# Patient Record
Sex: Male | Born: 1935 | Race: White | Hispanic: No | Marital: Married | Smoking: Never smoker
Health system: Southern US, Community
[De-identification: ages and names within clinical notes are randomized; demographics above are authoritative.]

## PROBLEM LIST (undated history)

## (undated) DIAGNOSIS — I499 Cardiac arrhythmia, unspecified: Secondary | ICD-10-CM

## (undated) DIAGNOSIS — C801 Malignant (primary) neoplasm, unspecified: Secondary | ICD-10-CM

## (undated) DIAGNOSIS — J302 Other seasonal allergic rhinitis: Secondary | ICD-10-CM

## (undated) DIAGNOSIS — M1712 Unilateral primary osteoarthritis, left knee: Secondary | ICD-10-CM

## (undated) DIAGNOSIS — I10 Essential (primary) hypertension: Secondary | ICD-10-CM

## (undated) DIAGNOSIS — G7000 Myasthenia gravis without (acute) exacerbation: Secondary | ICD-10-CM

## (undated) DIAGNOSIS — S52122A Displaced fracture of head of left radius, initial encounter for closed fracture: Secondary | ICD-10-CM

## (undated) DIAGNOSIS — E119 Type 2 diabetes mellitus without complications: Secondary | ICD-10-CM

## (undated) DIAGNOSIS — K219 Gastro-esophageal reflux disease without esophagitis: Secondary | ICD-10-CM

## (undated) HISTORY — DX: Unilateral primary osteoarthritis, left knee: M17.12

## (undated) HISTORY — PX: APPENDECTOMY: SHX54

## (undated) HISTORY — PX: TONSILLECTOMY: SUR1361

## (undated) HISTORY — PX: KNEE ARTHROSCOPY: SHX127

---
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
## 2005-12-01 ENCOUNTER — Emergency Department (HOSPITAL_COMMUNITY): Admission: EM | Admit: 2005-12-01 | Discharge: 2005-12-01 | Payer: Self-pay | Admitting: *Deleted

## 2012-01-11 DIAGNOSIS — G7000 Myasthenia gravis without (acute) exacerbation: Secondary | ICD-10-CM | POA: Insufficient documentation

## 2012-05-27 DIAGNOSIS — C61 Malignant neoplasm of prostate: Secondary | ICD-10-CM | POA: Insufficient documentation

## 2013-06-30 DIAGNOSIS — N529 Male erectile dysfunction, unspecified: Secondary | ICD-10-CM | POA: Insufficient documentation

## 2013-07-01 DIAGNOSIS — I10 Essential (primary) hypertension: Secondary | ICD-10-CM | POA: Insufficient documentation

## 2013-10-27 ENCOUNTER — Other Ambulatory Visit: Payer: Self-pay | Admitting: Orthopedic Surgery

## 2013-10-27 DIAGNOSIS — S52123A Displaced fracture of head of unspecified radius, initial encounter for closed fracture: Secondary | ICD-10-CM

## 2013-10-28 ENCOUNTER — Ambulatory Visit
Admission: RE | Admit: 2013-10-28 | Discharge: 2013-10-28 | Disposition: A | Discharge status: Home / Self Care | Payer: Medicare Other | Source: Ambulatory Visit | Attending: Orthopedic Surgery | Admitting: Orthopedic Surgery

## 2013-10-28 ENCOUNTER — Encounter (HOSPITAL_BASED_OUTPATIENT_CLINIC_OR_DEPARTMENT_OTHER): Payer: Self-pay | Admitting: *Deleted

## 2013-10-28 DIAGNOSIS — S52123A Displaced fracture of head of unspecified radius, initial encounter for closed fracture: Secondary | ICD-10-CM

## 2013-10-28 NOTE — Progress Notes (Signed)
Unable to reach up to 5pm-left message to be npo p mn-arrive 915am-bring all meds and take am meds with water only Called dr Mikey Bussing his pcp-no labs or ekg this yr

## 2013-10-29 ENCOUNTER — Encounter (HOSPITAL_BASED_OUTPATIENT_CLINIC_OR_DEPARTMENT_OTHER): Payer: Medicare Other | Admitting: Anesthesiology

## 2013-10-29 ENCOUNTER — Encounter (HOSPITAL_BASED_OUTPATIENT_CLINIC_OR_DEPARTMENT_OTHER): Payer: Self-pay

## 2013-10-29 ENCOUNTER — Ambulatory Visit (HOSPITAL_BASED_OUTPATIENT_CLINIC_OR_DEPARTMENT_OTHER): Payer: Medicare Other | Admitting: Anesthesiology

## 2013-10-29 ENCOUNTER — Encounter (HOSPITAL_BASED_OUTPATIENT_CLINIC_OR_DEPARTMENT_OTHER)
Admission: RE | Disposition: A | Discharge status: Home / Self Care | Payer: Self-pay | Source: Ambulatory Visit | Attending: Orthopedic Surgery

## 2013-10-29 ENCOUNTER — Ambulatory Visit (HOSPITAL_BASED_OUTPATIENT_CLINIC_OR_DEPARTMENT_OTHER)
Admission: RE | Admit: 2013-10-29 | Discharge: 2013-10-29 | Disposition: A | Discharge status: Home / Self Care | Payer: Medicare Other | Source: Ambulatory Visit | Attending: Orthopedic Surgery | Admitting: Orthopedic Surgery

## 2013-10-29 DIAGNOSIS — S52123A Displaced fracture of head of unspecified radius, initial encounter for closed fracture: Secondary | ICD-10-CM | POA: Insufficient documentation

## 2013-10-29 DIAGNOSIS — Z01810 Encounter for preprocedural cardiovascular examination: Secondary | ICD-10-CM | POA: Insufficient documentation

## 2013-10-29 DIAGNOSIS — Z8546 Personal history of malignant neoplasm of prostate: Secondary | ICD-10-CM | POA: Insufficient documentation

## 2013-10-29 DIAGNOSIS — X58XXXA Exposure to other specified factors, initial encounter: Secondary | ICD-10-CM | POA: Insufficient documentation

## 2013-10-29 DIAGNOSIS — G7000 Myasthenia gravis without (acute) exacerbation: Secondary | ICD-10-CM | POA: Insufficient documentation

## 2013-10-29 DIAGNOSIS — J301 Allergic rhinitis due to pollen: Secondary | ICD-10-CM | POA: Insufficient documentation

## 2013-10-29 DIAGNOSIS — Z79899 Other long term (current) drug therapy: Secondary | ICD-10-CM | POA: Insufficient documentation

## 2013-10-29 DIAGNOSIS — I10 Essential (primary) hypertension: Secondary | ICD-10-CM | POA: Insufficient documentation

## 2013-10-29 DIAGNOSIS — S52122A Displaced fracture of head of left radius, initial encounter for closed fracture: Secondary | ICD-10-CM

## 2013-10-29 DIAGNOSIS — K219 Gastro-esophageal reflux disease without esophagitis: Secondary | ICD-10-CM | POA: Insufficient documentation

## 2013-10-29 HISTORY — DX: Malignant (primary) neoplasm, unspecified: C80.1

## 2013-10-29 HISTORY — DX: Gastro-esophageal reflux disease without esophagitis: K21.9

## 2013-10-29 HISTORY — DX: Essential (primary) hypertension: I10

## 2013-10-29 HISTORY — DX: Displaced fracture of head of left radius, initial encounter for closed fracture: S52.122A

## 2013-10-29 HISTORY — PX: ORIF ELBOW FRACTURE: SHX5031

## 2013-10-29 HISTORY — DX: Myasthenia gravis without (acute) exacerbation: G70.00

## 2013-10-29 HISTORY — DX: Other seasonal allergic rhinitis: J30.2

## 2013-10-29 LAB — POCT I-STAT, CHEM 8
Calcium, Ion: 1.1 mmol/L — ABNORMAL LOW (ref 1.13–1.30)
Chloride: 103 mEq/L (ref 96–112)
Potassium: 3.7 mEq/L (ref 3.5–5.1)
Sodium: 141 mEq/L (ref 135–145)

## 2013-10-29 SURGERY — OPEN REDUCTION INTERNAL FIXATION (ORIF) ELBOW/OLECRANON FRACTURE
Anesthesia: General | Site: Elbow | Laterality: Left

## 2013-10-29 MED ORDER — LIDOCAINE HCL (CARDIAC) 20 MG/ML IV SOLN
INTRAVENOUS | Status: DC | PRN
  Administered 2013-10-29: 50 mg via INTRAVENOUS

## 2013-10-29 MED ORDER — PROPOFOL 10 MG/ML IV BOLUS
INTRAVENOUS | Status: DC | PRN
  Administered 2013-10-29: 200 mg via INTRAVENOUS

## 2013-10-29 MED ORDER — ONDANSETRON HCL 4 MG/2ML IJ SOLN: 4.0000 mg | Freq: Once | INTRAMUSCULAR | Status: DC | PRN

## 2013-10-29 MED ORDER — FENTANYL CITRATE 0.05 MG/ML IJ SOLN
50.0000 ug | Freq: Once | INTRAMUSCULAR | Status: AC
  Administered 2013-10-29: 100 ug via INTRAVENOUS

## 2013-10-29 MED ORDER — MIDAZOLAM HCL 2 MG/2ML IJ SOLN
INTRAMUSCULAR | Status: AC
  Filled 2013-10-29: qty 2

## 2013-10-29 MED ORDER — BUPIVACAINE-EPINEPHRINE PF 0.5-1:200000 % IJ SOLN
INTRAMUSCULAR | Status: DC | PRN
  Administered 2013-10-29: 25 mL via PERINEURAL

## 2013-10-29 MED ORDER — BUPIVACAINE HCL (PF) 0.5 % IJ SOLN
INTRAMUSCULAR | Status: AC
  Filled 2013-10-29: qty 30

## 2013-10-29 MED ORDER — ONDANSETRON HCL 4 MG/2ML IJ SOLN
INTRAMUSCULAR | Status: DC | PRN
  Administered 2013-10-29: 4 mg via INTRAVENOUS

## 2013-10-29 MED ORDER — EPHEDRINE SULFATE 50 MG/ML IJ SOLN
INTRAMUSCULAR | Status: DC | PRN
  Administered 2013-10-29 (×2): 10 mg via INTRAVENOUS
  Administered 2013-10-29: 5 mg via INTRAVENOUS
  Administered 2013-10-29: 10 mg via INTRAVENOUS

## 2013-10-29 MED ORDER — FENTANYL CITRATE 0.05 MG/ML IJ SOLN: 25.0000 ug | INTRAMUSCULAR | Status: DC | PRN

## 2013-10-29 MED ORDER — MIDAZOLAM HCL 2 MG/2ML IJ SOLN
1.0000 mg | INTRAMUSCULAR | Status: DC | PRN
  Administered 2013-10-29: 2 mg via INTRAVENOUS

## 2013-10-29 MED ORDER — FENTANYL CITRATE 0.05 MG/ML IJ SOLN
INTRAMUSCULAR | Status: AC
  Filled 2013-10-29: qty 2

## 2013-10-29 MED ORDER — BUPIVACAINE HCL (PF) 0.25 % IJ SOLN
INTRAMUSCULAR | Status: AC
  Filled 2013-10-29: qty 30

## 2013-10-29 MED ORDER — DEXAMETHASONE SODIUM PHOSPHATE 10 MG/ML IJ SOLN
INTRAMUSCULAR | Status: DC | PRN
  Administered 2013-10-29: 10 mg via INTRAVENOUS

## 2013-10-29 MED ORDER — CEFAZOLIN SODIUM-DEXTROSE 2-3 GM-% IV SOLR
INTRAVENOUS | Status: DC | PRN
  Administered 2013-10-29: 2 g via INTRAVENOUS

## 2013-10-29 MED ORDER — FENTANYL CITRATE 0.05 MG/ML IJ SOLN
INTRAMUSCULAR | Status: AC
  Filled 2013-10-29: qty 6

## 2013-10-29 MED ORDER — CEFAZOLIN SODIUM-DEXTROSE 2-3 GM-% IV SOLR
INTRAVENOUS | Status: AC
  Filled 2013-10-29: qty 50

## 2013-10-29 MED ORDER — LACTATED RINGERS IV SOLN
INTRAVENOUS | Status: DC
  Administered 2013-10-29 (×2): via INTRAVENOUS

## 2013-10-29 SURGICAL SUPPLY — 72 items
APL SKNCLS STERI-STRIP NONHPOA (GAUZE/BANDAGES/DRESSINGS) ×1
BANDAGE ELASTIC 3 VELCRO ST LF (GAUZE/BANDAGES/DRESSINGS) ×1 IMPLANT
BANDAGE ELASTIC 4 VELCRO ST LF (GAUZE/BANDAGES/DRESSINGS) ×2 IMPLANT
BENZOIN TINCTURE PRP APPL 2/3 (GAUZE/BANDAGES/DRESSINGS) ×2 IMPLANT
BLADE AVERAGE 25X9 (BLADE) ×1 IMPLANT
BLADE MINI RND TIP GREEN BEAV (BLADE) IMPLANT
BLADE SURG 15 STRL LF DISP TIS (BLADE) ×1 IMPLANT
BLADE SURG 15 STRL SS (BLADE) ×2
BNDG CMPR 9X4 STRL LF SNTH (GAUZE/BANDAGES/DRESSINGS) ×1
BNDG CMPR MD 5X2 ELC HKLP STRL (GAUZE/BANDAGES/DRESSINGS)
BNDG COHESIVE 4X5 TAN STRL (GAUZE/BANDAGES/DRESSINGS) ×1 IMPLANT
BNDG ELASTIC 2 VLCR STRL LF (GAUZE/BANDAGES/DRESSINGS) IMPLANT
BNDG ESMARK 4X9 LF (GAUZE/BANDAGES/DRESSINGS) ×2 IMPLANT
CANISTER SUCT 1200ML W/VALVE (MISCELLANEOUS) ×2 IMPLANT
COVER TABLE BACK 60X90 (DRAPES) ×2 IMPLANT
CUFF TOURNIQUET SINGLE 18IN (TOURNIQUET CUFF) IMPLANT
DECANTER SPIKE VIAL GLASS SM (MISCELLANEOUS) ×1 IMPLANT
DRAPE EXTREMITY T 121X128X90 (DRAPE) ×2 IMPLANT
DRAPE OEC MINIVIEW 54X84 (DRAPES) ×2 IMPLANT
DRAPE U 20/CS (DRAPES) ×2 IMPLANT
DRAPE U-SHAPE 47X51 STRL (DRAPES) ×2 IMPLANT
DURAPREP 26ML APPLICATOR (WOUND CARE) ×2 IMPLANT
ELECT REM PT RETURN 9FT ADLT (ELECTROSURGICAL) ×2
ELECTRODE REM PT RTRN 9FT ADLT (ELECTROSURGICAL) ×1 IMPLANT
GAUZE XEROFORM 1X8 LF (GAUZE/BANDAGES/DRESSINGS) IMPLANT
GLOVE BIO SURGEON STRL SZ8 (GLOVE) ×2 IMPLANT
GLOVE BIOGEL M STRL SZ7.5 (GLOVE) ×1 IMPLANT
GLOVE BIOGEL PI IND STRL 8 (GLOVE) ×2 IMPLANT
GLOVE BIOGEL PI INDICATOR 8 (GLOVE) ×3
GLOVE ORTHO TXT STRL SZ7.5 (GLOVE) ×2 IMPLANT
GOWN BRE IMP PREV XXLGXLNG (GOWN DISPOSABLE) ×4 IMPLANT
HEAD SYS ANA RAD 28.00 HEAD LT (Head) ×1 IMPLANT
NDL HYPO 25X1 1.5 SAFETY (NEEDLE) IMPLANT
NEEDLE HYPO 25X1 1.5 SAFETY (NEEDLE) IMPLANT
NS IRRIG 1000ML POUR BTL (IV SOLUTION) ×2 IMPLANT
PACK BASIN DAY SURGERY FS (CUSTOM PROCEDURE TRAY) ×2 IMPLANT
PAD CAST 3X4 CTTN HI CHSV (CAST SUPPLIES) IMPLANT
PAD CAST 4YDX4 CTTN HI CHSV (CAST SUPPLIES) ×1 IMPLANT
PADDING CAST ABS 4INX4YD NS (CAST SUPPLIES) ×2
PADDING CAST ABS COTTON 4X4 ST (CAST SUPPLIES) ×1 IMPLANT
PADDING CAST COTTON 3X4 STRL (CAST SUPPLIES) ×2
PADDING CAST COTTON 4X4 STRL (CAST SUPPLIES) ×4
PADDING UNDERCAST 2  STERILE (CAST SUPPLIES) ×1 IMPLANT
PENCIL BUTTON HOLSTER BLD 10FT (ELECTRODE) ×2 IMPLANT
SLEEVE SCD COMPRESS KNEE MED (MISCELLANEOUS) ×2 IMPLANT
SLING ARM FOAM STRAP LRG (SOFTGOODS) ×1 IMPLANT
SPLINT FAST PLASTER 5X30 (CAST SUPPLIES) ×10
SPLINT PLASTER CAST FAST 5X30 (CAST SUPPLIES) IMPLANT
SPLINT PLASTER CAST XFAST 4X15 (CAST SUPPLIES) IMPLANT
SPLINT PLASTER XTRA FAST SET 4 (CAST SUPPLIES)
SPONGE GAUZE 4X4 12PLY (GAUZE/BANDAGES/DRESSINGS) ×2 IMPLANT
SPONGE LAP 4X18 X RAY DECT (DISPOSABLE) ×2 IMPLANT
STEM RADIAL HEAD 9X+6X25 TITA (Stem) ×1 IMPLANT
STOCKINETTE 4X48 STRL (DRAPES) ×2 IMPLANT
STRIP CLOSURE SKIN 1/2X4 (GAUZE/BANDAGES/DRESSINGS) ×2 IMPLANT
SUCTION FRAZIER TIP 10 FR DISP (SUCTIONS) ×2 IMPLANT
SUT ETHIBOND 0 MO6 C/R (SUTURE) IMPLANT
SUT ETHILON 3 0 PS 1 (SUTURE) IMPLANT
SUT ETHILON 4 0 PS 2 18 (SUTURE) IMPLANT
SUT MNCRL AB 4-0 PS2 18 (SUTURE) ×3 IMPLANT
SUT VIC AB 0 CT3 27 (SUTURE) ×1 IMPLANT
SUT VIC AB 0 SH 27 (SUTURE) IMPLANT
SUT VIC AB 2-0 SH 27 (SUTURE) ×2
SUT VIC AB 2-0 SH 27XBRD (SUTURE) IMPLANT
SUT VIC AB 3-0 SH 27 (SUTURE) ×2
SUT VIC AB 3-0 SH 27X BRD (SUTURE) ×1 IMPLANT
SUT VICRYL 3-0 CR8 SH (SUTURE) IMPLANT
SYR BULB 3OZ (MISCELLANEOUS) ×3 IMPLANT
SYR CONTROL 10ML LL (SYRINGE) IMPLANT
TOWEL OR 17X24 6PK STRL BLUE (TOWEL DISPOSABLE) ×2 IMPLANT
TUBE CONNECTING 20X1/4 (TUBING) ×2 IMPLANT
UNDERPAD 30X30 INCONTINENT (UNDERPADS AND DIAPERS) ×2 IMPLANT

## 2013-10-29 NOTE — Anesthesia Preprocedure Evaluation (Signed)
Anesthesia Evaluation  Patient identified by MRN, date of birth, ID band Patient awake    Reviewed: Allergy & Precautions, H&P , NPO status , Patient's Chart, lab work & pertinent test results  Airway Mallampati: I TM Distance: >3 FB Neck ROM: Full    Dental  (+) Teeth Intact and Dental Advisory Given   Pulmonary  breath sounds clear to auscultation        Cardiovascular hypertension, Pt. on medications Rhythm:Regular Rate:Normal     Neuro/Psych  Neuromuscular disease    GI/Hepatic GERD-  Medicated and Controlled,  Endo/Other    Renal/GU      Musculoskeletal   Abdominal   Peds  Hematology   Anesthesia Other Findings   Reproductive/Obstetrics                           Anesthesia Physical Anesthesia Plan  ASA: III  Anesthesia Plan: General   Post-op Pain Management:    Induction: Intravenous  Airway Management Planned: LMA  Additional Equipment:   Intra-op Plan:   Post-operative Plan: Extubation in OR  Informed Consent: I have reviewed the patients History and Physical, chart, labs and discussed the procedure including the risks, benefits and alternatives for the proposed anesthesia with the patient or authorized representative who has indicated his/her understanding and acceptance.   Dental advisory given  Plan Discussed with: CRNA, Anesthesiologist and Surgeon  Anesthesia Plan Comments:         Anesthesia Quick Evaluation

## 2013-10-29 NOTE — Anesthesia Postprocedure Evaluation (Signed)
  Anesthesia Post-op Note  Patient: Gary Sherman  Procedure(s) Performed: Procedure(s):  LEFT RADIAL HEAD  ARTHROPLASTY (Left)  Patient Location: PACU  Anesthesia Type:GA combined with regional for post-op pain  Level of Consciousness: awake, alert  and oriented  Airway and Oxygen Therapy: Patient Spontanous Breathing and Patient connected to face mask oxygen  Post-op Pain: none  Post-op Assessment: Post-op Vital signs reviewed  Post-op Vital Signs: Reviewed  Complications: No apparent anesthesia complications

## 2013-10-29 NOTE — Anesthesia Procedure Notes (Addendum)
Anesthesia Regional Block:  Supraclavicular block  Pre-Anesthetic Checklist: ,, timeout performed, Correct Patient, Correct Site, Correct Laterality, Correct Procedure, Correct Position, site marked, Risks and benefits discussed,  Surgical consent,  Pre-op evaluation,  At surgeon's request and post-op pain management  Laterality: Left and Upper  Prep: chloraprep       Needles:  Injection technique: Single-shot  Needle Type: Echogenic Stimulator Needle     Needle Length: 5cm 5 cm Needle Gauge: 21 and 21 G    Additional Needles:  Procedures: ultrasound guided (picture in chart) Supraclavicular block Narrative:  Start time: 10/29/2013 11:45 AM End time: 10/29/2013 11:55 AM Injection made incrementally with aspirations every 5 mL.  Performed by: Personally  Anesthesiologist: Sheldon Silvan   Procedure Name: LMA Insertion Date/Time: 10/29/2013 12:14 PM Performed by: Caren Macadam Pre-anesthesia Checklist: Patient identified, Emergency Drugs available, Suction available and Patient being monitored Patient Re-evaluated:Patient Re-evaluated prior to inductionOxygen Delivery Method: Circle System Utilized Preoxygenation: Pre-oxygenation with 100% oxygen Intubation Type: IV induction Ventilation: Mask ventilation without difficulty LMA: LMA inserted LMA Size: 5.0 Number of attempts: 1 Airway Equipment and Method: bite block Placement Confirmation: positive ETCO2 and breath sounds checked- equal and bilateral Tube secured with: Tape Dental Injury: Teeth and Oropharynx as per pre-operative assessment

## 2013-10-29 NOTE — Transfer of Care (Signed)
Immediate Anesthesia Transfer of Care Note  Patient: Gary Sherman  Procedure(s) Performed: Procedure(s):  LEFT RADIAL HEAD  ARTHROPLASTY (Left)  Patient Location: PACU  Anesthesia Type:GA combined with regional for post-op pain  Level of Consciousness: awake, oriented and patient cooperative  Airway & Oxygen Therapy: Patient Spontanous Breathing and Patient connected to face mask oxygen  Post-op Assessment: Report given to PACU RN and Post -op Vital signs reviewed and stable  Post vital signs: Reviewed and stable  Complications: No apparent anesthesia complications

## 2013-10-29 NOTE — H&P (Signed)
PREOPERATIVE H&P  Chief Complaint: left radial head fracture  HPI: Gary Sherman is a 77 y.o. male who presents for preoperative history and physical with a diagnosis of left radial head fracture. Symptoms are rated as moderate to severe, and have been worsening.  This is significantly impairing activities of daily living.  He has elected for surgical management. He has had previous elbow dislocation, and reinjured his elbow. Denies other injuries.  Past Medical History  Diagnosis Date  . Hypertension   . GERD (gastroesophageal reflux disease)   . Myasthenia gravis   . Seasonal allergies   . Cancer     prostate 2007 radation Rx   Past Surgical History  Procedure Laterality Date  . Tonsillectomy    . Appendectomy    . Knee arthroscopy     History   Social History  . Marital Status: Married    Spouse Name: N/A    Number of Children: N/A  . Years of Education: N/A   Social History Main Topics  . Smoking status: Never Smoker   . Smokeless tobacco: None  . Alcohol Use: 1.2 oz/week    2 Cans of beer per week     Comment: occ  . Drug Use: None  . Sexual Activity: None   Other Topics Concern  . None   Social History Narrative  . None   Family History  Problem Relation Age of Onset  . Diabetes type II Father    Allergies  Allergen Reactions  . Nitroglycerin Anaphylaxis   Prior to Admission medications   Medication Sig Start Date End Date Taking? Authorizing Provider  acetaminophen-codeine (TYLENOL #3) 300-30 MG per tablet Take by mouth every 4 (four) hours as needed for moderate pain.   Yes Historical Provider, MD  azaTHIOprine (IMURAN) 50 MG tablet Take 50 mg by mouth daily. Take 150mg  daily   Yes Historical Provider, MD  fluticasone (FLONASE) 50 MCG/ACT nasal spray Place into both nostrils daily.   Yes Historical Provider, MD  ketotifen (ZADITOR) 0.025 % ophthalmic solution 1 drop 2 (two) times daily.   Yes Historical Provider, MD  olmesartan (BENICAR) 40 MG  tablet Take 40 mg by mouth daily.   Yes Historical Provider, MD  omeprazole (PRILOSEC) 20 MG capsule Take 20 mg by mouth daily.   Yes Historical Provider, MD  prednisoLONE 5 MG TABS tablet Take 5 mg by mouth daily.   Yes Historical Provider, MD     Positive ROS: All other systems have been reviewed and were otherwise negative with the exception of those mentioned in the HPI and as above.  Physical Exam: General: Alert, no acute distress Cardiovascular: No pedal edema Respiratory: No cyanosis, no use of accessory musculature GI: No organomegaly, abdomen is soft and non-tender Skin: No lesions in the area of chief complaint Neurologic: Sensation intact distally Psychiatric: Patient is competent for consent with normal mood and affect Lymphatic: No axillary or cervical lymphadenopathy  MUSCULOSKELETAL: all fingers f/e/abduct, pain at elbow, none at wrist  Assessment: left radial head fracture  Plan: Plan for Procedure(s): OPEN REDUCTION INTERNAL FIXATION (ORIF) LEFT RADIAL HEAD VS ARTHROPLASTY  The risks benefits and alternatives were discussed with the patient including but not limited to the risks of nonoperative treatment, versus surgical intervention including infection, bleeding, nerve injury,  blood clots, cardiopulmonary complications, morbidity, mortality, among others, and they were willing to proceed.   Eulas Post, MD Cell [phone]   10/29/2013 11:56 AM

## 2013-10-29 NOTE — Op Note (Signed)
10/29/2013  1:44 PM  PATIENT:  Gary Sherman    PRE-OPERATIVE DIAGNOSIS:  left radial head fracture  POST-OPERATIVE DIAGNOSIS:  Same  PROCEDURE:   LEFT RADIAL HEAD  ARTHROPLASTY  SURGEON:  Eulas Post, MD  PHYSICIAN ASSISTANT: Janace Litten, OPA-C, present and scrubbed during the case, assisting with closure.  ANESTHESIA:   General  PREOPERATIVE INDICATIONS:  [name] is a  77 y.o. male with a diagnosis of left radial head fracture who  elected for surgical management.  His fracture was extremely displaced, with comminution, unrepairable, requiring arthroplasty.  The risks benefits and alternatives were discussed with the patient preoperatively including but not limited to the risks of infection, bleeding, nerve injury, cardiopulmonary complications, the need for revision surgery, among others, and the patient was willing to proceed.  OPERATIVE IMPLANTS: Acumed radial head with a size 9 stem, and 28 mm of radial head with a +6 neck.  OPERATIVE FINDINGS: Comminuted radial head fracture, with severe displacement  OPERATIVE PROCEDURE: The patient is brought to the operating room and placed in the supine position. General anesthesia was administered. IV antibiotics were given. The left upper extremity was prepped and draped in usual sterile fashion. The arm was elevated and exsanguinated and a tourniquet was inflated. Approximate tourniquet time was one hour. Time out was performed. Lateral incision was carried out to the proximal radius, taking care to preserve the lateral collateral ligament, although he had hypertrophic bone on the lateral side, secondary to previous dislocations within the substance of the ligament.  Incision was made through the capsule, and dissection carried down, and the previous radial head fragments that were still contained were extracted. The columns that was still attached to the shaft was fairly poor quality, and not amenable to repair.  I  then freshened the neck cut, and then sequentially reamed, up to a size 9. I trialed the length using the appropriate jig, and selected the size 6. I attempted reduction of the actual implant with the size 6, although if this is too tight, couldn't quite get this reduced, and so I removed a little bit more of the neck and then reduced the prosthesis. Excellent stability was achieved, and the elbow dropped easily to 0, without any impingement, and without any stiffness the soft tissue tension was appropriate.  The wounds were irrigated copiously, the deep tissue repaired with Vicryl, followed by Vicryl for the subcutaneous tissue with Monocryl and Steri-Strips for the skin. He had a regional preoperative block. Posterior splint was applied. He was awakened and returned to PACU in stable and satisfactory condition. There were no complications.

## 2013-10-29 NOTE — Progress Notes (Signed)
  Assisted Dr. Crews with left, ultrasound guided, supraclavicular block. Side rails up, monitors on throughout procedure. See vital signs in flow sheet. Tolerated Procedure well. 

## 2013-10-30 ENCOUNTER — Encounter (HOSPITAL_BASED_OUTPATIENT_CLINIC_OR_DEPARTMENT_OTHER): Payer: Self-pay | Admitting: Orthopedic Surgery

## 2019-01-27 ENCOUNTER — Encounter (HOSPITAL_COMMUNITY): Payer: Self-pay

## 2019-01-27 NOTE — Progress Notes (Signed)
Please place orders in epic . Pt. Scheduled for a preop.Thank You!

## 2019-01-27 NOTE — Patient Instructions (Addendum)
Gary Sherman  01/27/2019   Your procedure is scheduled on: 01-30-19  Report to [facility] Main  Entrance               Report to  Short stay at      0530  AM    Call this number if you have problems the morning of surgery [phone]   Remember: Do not eat food or drink liquids :After Midnight. BRUSH YOUR TEETH MORNING OF SURGERY AND RINSE YOUR MOUTH OUT, NO CHEWING GUM CANDY OR MINTS.     Take these medicines the morning of surgery with A SIP OF WATER: omeprazole, eye drops, gabapentin, flonase DO NOT TAKE ANY DIABETIC MEDICATIONS DAY OF YOUR SURGERY                               You may not have any metal on your body including hair pins and              piercings  Do not wear jewelry,  lotions, powders or perfumes, deodorant                  Men may shave face and neck.   Do not bring valuables to the hospital. [place].  Contacts, dentures or bridgework may not be worn into surgery.  Leave suitcase in the car. After surgery it may be brought to your room.                Please read over the following fact sheets you were given: _____________________________________________________________________           [facility] - Preparing for Surgery Before surgery, you can play an important role.  Because skin is not sterile, your skin needs to be as free of germs as possible.  You can reduce the number of germs on your skin by washing with CHG (chlorahexidine gluconate) soap before surgery.  CHG is an antiseptic cleaner which kills germs and bonds with the skin to continue killing germs even after washing. Please DO NOT use if you have an allergy to CHG or antibacterial soaps.  If your skin becomes reddened/irritated stop using the CHG and inform your nurse when you arrive at Short Stay. Do not shave (including legs and underarms) for at least 48 hours prior to the first CHG shower.  You may shave your  face/neck. Please follow these instructions carefully:  1.  Shower with CHG Soap the night before surgery and the  morning of Surgery.  2.  If you choose to wash your hair, wash your hair first as usual with your  normal  shampoo.  3.  After you shampoo, rinse your hair and body thoroughly to remove the  shampoo.                           4.  Use CHG as you would any other liquid soap.  You can apply chg directly  to the skin and wash                       Gently with a scrungie or clean washcloth.  5.  Apply the CHG Soap to  your body ONLY FROM THE NECK DOWN.   Do not use on face/ open                           Wound or open sores. Avoid contact with eyes, ears mouth and genitals (private parts).                       Wash face,  Genitals (private parts) with your normal soap.             6.  Wash thoroughly, paying special attention to the area where your surgery  will be performed.  7.  Thoroughly rinse your body with warm water from the neck down.  8.  DO NOT shower/wash with your normal soap after using and rinsing off  the CHG Soap.                9.  Pat yourself dry with a clean towel.            10.  Wear clean pajamas.            11.  Place clean sheets on your bed the night of your first shower and do not  sleep with pets. Day of Surgery : Do not apply any lotions/deodorants the morning of surgery.  Please wear clean clothes to the hospital/surgery center.  FAILURE TO FOLLOW THESE INSTRUCTIONS MAY RESULT IN THE CANCELLATION OF YOUR SURGERY PATIENT SIGNATURE_________________________________  NURSE SIGNATURE__________________________________  ________________________________________________________________________  WHAT IS A BLOOD TRANSFUSION? Blood Transfusion Information  A transfusion is the replacement of blood or some of its parts. Blood is made up of multiple cells which provide different functions.  Red blood cells carry oxygen and are used for blood loss  replacement.  White blood cells fight against infection.  Platelets control bleeding.  Plasma helps clot blood.  Other blood products are available for specialized needs, such as hemophilia or other clotting disorders. BEFORE THE TRANSFUSION  Who gives blood for transfusions?   Healthy volunteers who are fully evaluated to make sure their blood is safe. This is blood bank blood. Transfusion therapy is the safest it has ever been in the practice of medicine. Before blood is taken from a donor, a complete history is taken to make sure that person has no history of diseases nor engages in risky social behavior (examples are intravenous drug use or sexual activity with multiple partners). The donor's travel history is screened to minimize risk of transmitting infections, such as malaria. The donated blood is tested for signs of infectious diseases, such as HIV and hepatitis. The blood is then tested to be sure it is compatible with you in order to minimize the chance of a transfusion reaction. If you or a relative donates blood, this is often done in anticipation of surgery and is not appropriate for emergency situations. It takes many days to process the donated blood. RISKS AND COMPLICATIONS Although transfusion therapy is very safe and saves many lives, the main dangers of transfusion include:   Getting an infectious disease.  Developing a transfusion reaction. This is an allergic reaction to something in the blood you were given. Every precaution is taken to prevent this. The decision to have a blood transfusion has been considered carefully by your caregiver before blood is given. Blood is not given unless the benefits outweigh the risks. AFTER THE TRANSFUSION  Right after receiving a blood transfusion, you will usually  feel much better and more energetic. This is especially true if your red blood cells have gotten low (anemic). The transfusion raises the level of the red blood cells which  carry oxygen, and this usually causes an energy increase.  The nurse administering the transfusion will monitor you carefully for complications. HOME CARE INSTRUCTIONS  No special instructions are needed after a transfusion. You may find your energy is better. Speak with your caregiver about any limitations on activity for underlying diseases you may have. SEEK MEDICAL CARE IF:   Your condition is not improving after your transfusion.  You develop redness or irritation at the intravenous (IV) site. SEEK IMMEDIATE MEDICAL CARE IF:  Any of the following symptoms occur over the next 12 hours:  Shaking chills.  You have a temperature by mouth above 102 F (38.9 C), not controlled by medicine.  Chest, back, or muscle pain.  People around you feel you are not acting correctly or are confused.  Shortness of breath or difficulty breathing.  Dizziness and fainting.  You get a rash or develop hives.  You have a decrease in urine output.  Your urine turns a dark color or changes to pink, red, or brown. Any of the following symptoms occur over the next 10 days:  You have a temperature by mouth above 102 F (38.9 C), not controlled by medicine.  Shortness of breath.  Weakness after normal activity.  The white part of the eye turns yellow (jaundice).  You have a decrease in the amount of urine or are urinating less often.  Your urine turns a dark color or changes to pink, red, or brown. Document Released: 10/26/2000 Document Revised: 01/21/2012 Document Reviewed: 06/14/2008 Exit[employer] Patient Information 2014 [place].  _______________________________________________________________________  Incentive Spirometer  An incentive spirometer is a tool that can help keep your lungs clear and active. This tool measures how well you are filling your lungs with each breath. Taking long deep breaths may help reverse or decrease the chance of developing breathing (pulmonary) problems  (especially infection) following:  A long period of time when you are unable to move or be active. BEFORE THE PROCEDURE   If the spirometer includes an indicator to show your best effort, your nurse or respiratory therapist will set it to a desired goal.  If possible, sit up straight or lean slightly forward. Try not to slouch.  Hold the incentive spirometer in an upright position. INSTRUCTIONS FOR USE  1. Sit on the edge of your bed if possible, or sit up as far as you can in bed or on a chair. 2. Hold the incentive spirometer in an upright position. 3. Breathe out normally. 4. Place the mouthpiece in your mouth and seal your lips tightly around it. 5. Breathe in slowly and as deeply as possible, raising the piston or the ball toward the top of the column. 6. Hold your breath for 3-5 seconds or for as long as possible. Allow the piston or ball to fall to the bottom of the column. 7. Remove the mouthpiece from your mouth and breathe out normally. 8. Rest for a few seconds and repeat Steps 1 through 7 at least 10 times every 1-2 hours when you are awake. Take your time and take a few normal breaths between deep breaths. 9. The spirometer may include an indicator to show your best effort. Use the indicator as a goal to work toward during each repetition. 10. After each set of 10 deep breaths, practice coughing to  be sure your lungs are clear. If you have an incision (the cut made at the time of surgery), support your incision when coughing by placing a pillow or rolled up towels firmly against it. Once you are able to get out of bed, walk around indoors and cough well. You may stop using the incentive spirometer when instructed by your caregiver.  RISKS AND COMPLICATIONS  Take your time so you do not get dizzy or light-headed.  If you are in pain, you may need to take or ask for pain medication before doing incentive spirometry. It is harder to take a deep breath if you are having  pain. AFTER USE  Rest and breathe slowly and easily.  It can be helpful to keep track of a log of your progress. Your caregiver can provide you with a simple table to help with this. If you are using the spirometer at home, follow these instructions: [place] IF:   You are having difficultly using the spirometer.  You have trouble using the spirometer as often as instructed.  Your pain medication is not giving enough relief while using the spirometer.  You develop fever of 100.5 F (38.1 C) or higher. SEEK IMMEDIATE MEDICAL CARE IF:   You cough up bloody sputum that had not been present before.  You develop fever of 102 F (38.9 C) or greater.  You develop worsening pain at or near the incision site. MAKE SURE YOU:   Understand these instructions.  Will watch your condition.  Will get help right away if you are not doing well or get worse. Document Released: 03/11/2007 Document Revised: 01/21/2012 Document Reviewed: 05/12/2007 [facility] Patient Information 2014 [place], [place].   ________________________________________________________________________

## 2019-01-28 ENCOUNTER — Encounter (HOSPITAL_COMMUNITY): Payer: Medicare Other

## 2019-01-29 ENCOUNTER — Encounter (HOSPITAL_COMMUNITY)
Admission: RE | Admit: 2019-01-29 | Discharge: 2019-01-29 | Disposition: A | Discharge status: Home / Self Care | Payer: Medicare Other | Source: Ambulatory Visit | Attending: Internal Medicine | Admitting: Internal Medicine

## 2019-02-09 ENCOUNTER — Inpatient Hospital Stay: Admit: 2019-02-09 | Payer: Medicare Other | Admitting: Orthopedic Surgery

## 2019-02-09 SURGERY — ARTHROPLASTY, KNEE, TOTAL
Anesthesia: Spinal | Laterality: Left

## 2019-03-12 ENCOUNTER — Encounter: Payer: Self-pay | Admitting: Physician Assistant

## 2019-03-12 ENCOUNTER — Other Ambulatory Visit: Payer: Self-pay | Admitting: Physician Assistant

## 2019-03-12 DIAGNOSIS — M1712 Unilateral primary osteoarthritis, left knee: Secondary | ICD-10-CM

## 2019-03-12 HISTORY — DX: Unilateral primary osteoarthritis, left knee: M17.12

## 2019-03-12 NOTE — H&P (Signed)
TOTAL KNEE ADMISSION H&P  Patient is being admitted for left total knee arthroplasty.  Subjective:  Chief Complaint:left knee pain.  HPI: Gary Sherman, 83 y.o. male, has a history of pain and functional disability in the left knee due to arthritis and has failed non-surgical conservative treatments for greater than 12 weeks to includeNSAID's and/or analgesics, corticosteriod injections, viscosupplementation injections, flexibility and strengthening excercises, use of assistive devices and activity modification.  Onset of symptoms was gradual, starting 10 years ago with gradually worsening course since that time. The patient noted prior procedures on the knee to include  arthroscopy and menisectomy on the left knee(s).  Patient currently rates pain in the left knee(s) at 10 out of 10 with activity. Patient has night pain, worsening of pain with activity and weight bearing, pain that interferes with activities of daily living, crepitus and joint swelling.  Patient has evidence of subchondral sclerosis, periarticular osteophytes and joint space narrowing by imaging studies. There is no active infection.  Patient Active Problem List   Diagnosis Date Noted  . Primary localized osteoarthritis of left knee 03/12/2019    Priority: High  . Fracture of radial head, left, closed 10/29/2013  . Essential hypertension 07/01/2013  . Impotence of organic origin 06/30/2013  . Malignant neoplasm of prostate ([place]) 05/27/2012  . Myasthenia gravis without exacerbation ([place]) 01/11/2012   Past Medical History:  Diagnosis Date  . Cancer [facility])    prostate 2007 radation Rx  . Fracture of radial head, left, closed 10/29/2013  . GERD (gastroesophageal reflux disease)   . Hypertension   . Myasthenia gravis ([place])   . Primary localized osteoarthritis of left knee 03/12/2019  . Seasonal allergies     Past Surgical History:  Procedure Laterality Date  . APPENDECTOMY    . KNEE ARTHROSCOPY    . ORIF ELBOW  FRACTURE Left 10/29/2013   Procedure:  LEFT RADIAL HEAD  ARTHROPLASTY;  Surgeon: Johnny Bridge, MD;  Location: [place];  Service: Orthopedics;  Laterality: Left;  . TONSILLECTOMY      Current Outpatient Medications  Medication Sig Dispense Refill Last Dose  . aspirin 81 MG chewable tablet Chew by mouth daily.     Marland Kitchen azaTHIOprine (IMURAN) 50 MG tablet Take 150 mg by mouth daily.    10/28/2013 at Unknown time  . Cholecalciferol (VITAMIN D3) 25 MCG (1000 UT) CAPS Take 1,000 Units by mouth daily.     . fluticasone (FLONASE) 50 MCG/ACT nasal spray Place into both nostrils daily.   10/28/2013 at Unknown time  . gabapentin (NEURONTIN) 300 MG capsule Take 900 mg by mouth 3 (three) times daily.     . insulin glargine (LANTUS) 100 UNIT/ML injection Inject 35 Units into the skin daily.     Marland Kitchen ketotifen (ZADITOR) 0.025 % ophthalmic solution 1 drop 2 (two) times daily.   Past Month at Unknown time  . olmesartan (BENICAR) 20 MG tablet Take 20 mg by mouth daily.    10/28/2013 at Unknown time  . omeprazole (PRILOSEC) 20 MG capsule Take 20 mg by mouth daily.   Past Week at Unknown time  . predniSONE (DELTASONE) 2.5 MG tablet Take 2.5 mg by mouth See admin instructions. MWF      No current facility-administered medications for this visit.    Allergies  Allergen Reactions  . Nitroglycerin Anaphylaxis    Social History   Tobacco Use  . Smoking status: Never Smoker  Substance Use Topics  . Alcohol use: Yes  Alcohol/week: 2.0 standard drinks    Types: 2 Cans of beer per week    Comment: occ    Family History  Problem Relation Age of Onset  . Diabetes type II Father      Review of Systems  Constitutional: Negative.   HENT: Negative.   Eyes: Negative.   Respiratory: Negative.   Cardiovascular: Negative.   Gastrointestinal: Negative.   Genitourinary: Negative.   Musculoskeletal: Positive for joint pain.  Skin: Negative.   Neurological: Negative.   Endo/Heme/Allergies:  Negative.   Psychiatric/Behavioral: Negative.     Objective:  Physical Exam  Constitutional: He is oriented to person, place, and time. He appears well-developed and well-nourished.  HENT:  Head: Normocephalic and atraumatic.  Eyes: Pupils are equal, round, and reactive to light. Conjunctivae are normal.  Neck: Neck supple.  Respiratory: Effort normal and breath sounds normal.  GI: Soft. Bowel sounds are normal.  Genitourinary:    Genitourinary Comments: Not pertinent to current symptomatology therefore not examined.   Musculoskeletal:     Comments: Examination of both knees reveal pain bilaterally, left worse than right.  1+ crepitation.  1+ synovitis.  Range of motion 0-120 degrees.  Moderate varus deformity.  Knees are stable with normal patella tracking.  Vascular exam: Pulses are 2+ and symmetric.  Neurologic exam: Distal motor and sensory examination is within normal limits.  Left knee is more painful than right  Neurological: He is alert and oriented to person, place, and time.  Skin: Skin is warm and dry.  Psychiatric: He has a normal mood and affect. His behavior is normal.    Vital signs in last 24 hours: Height 5'11.5" Weight 227lbs BP 147/78 Pulse 58 O2 sat 97%  Labs:   Estimated body mass index is 31.22 kg/m as calculated from the following:   Height as of this encounter: 5' 11.5" (1.816 m).   Weight as of this encounter: 103 kg.   Imaging Review Plain radiographs demonstrate severe degenerative joint disease of the left knee(s). The overall alignment issignificant varus. The bone quality appears to be good for age and reported activity level.      Assessment/Plan:  End stage arthritis, left knee  Active Problems:   * No active hospital problems. *   The patient history, physical examination, clinical judgment of the provider and imaging studies are consistent with end stage degenerative joint disease of the left knee(s) and total knee arthroplasty is  deemed medically necessary. The treatment options including medical management, injection therapy arthroscopy and arthroplasty were discussed at length. The risks and benefits of total knee arthroplasty were presented and reviewed. The risks due to aseptic loosening, infection, stiffness, patella tracking problems, thromboembolic complications and other imponderables were discussed. The patient acknowledged the explanation, agreed to proceed with the plan and consent was signed. Patient is being admitted for inpatient treatment for surgery, pain control, PT, OT, prophylactic antibiotics, VTE prophylaxis, progressive ambulation and ADL's and discharge planning. The patient is planning to be discharged home with home health services    Anticipated LOS equal to or greater than 2 midnights due to - Age 33 and older with one or more of the following:  - Obesity  - Expected need for hospital services (PT, OT, Nursing) required for safe  discharge  - Anticipated need for postoperative skilled nursing care or inpatient rehab  - Active co-morbidities: Diabetes OR   - Unanticipated findings during/Post Surgery: None  - Patient is a high risk of re-admission due to: None

## 2019-03-23 ENCOUNTER — Encounter (HOSPITAL_COMMUNITY): Admission: RE | Payer: Self-pay | Source: Home / Self Care

## 2019-03-23 ENCOUNTER — Inpatient Hospital Stay (HOSPITAL_COMMUNITY): Admission: RE | Admit: 2019-03-23 | Payer: Medicare Other | Source: Home / Self Care | Admitting: Orthopedic Surgery

## 2019-03-23 SURGERY — ARTHROPLASTY, KNEE, TOTAL
Anesthesia: Spinal | Laterality: Left

## 2019-05-11 ENCOUNTER — Encounter: Payer: Self-pay | Admitting: Physician Assistant

## 2019-05-11 DIAGNOSIS — E119 Type 2 diabetes mellitus without complications: Secondary | ICD-10-CM

## 2019-05-11 HISTORY — DX: Type 2 diabetes mellitus without complications: E11.9

## 2019-05-11 NOTE — H&P (Addendum)
TOTAL KNEE ADMISSION H&P  Patient is being admitted for left total knee arthroplasty.  Subjective:  Chief Complaint:left knee pain.  HPI: Gary Sherman, 83 y.o. male, has a history of pain and functional disability in the left knee due to arthritis and has failed non-surgical conservative treatments for greater than 12 weeks to includeNSAID's and/or analgesics, corticosteriod injections, viscosupplementation injections, flexibility and strengthening excercises, use of assistive devices and activity modification.  Onset of symptoms was gradual, starting 10 years ago with gradually worsening course since that time. The patient noted prior procedures on the knee to include  arthroscopy and menisectomy on the left knee(s).  Patient currently rates pain in the left knee(s) at 10 out of 10 with activity. Patient has night pain, worsening of pain with activity and weight bearing, pain that interferes with activities of daily living, crepitus and joint swelling.  Patient has evidence of subchondral sclerosis, periarticular osteophytes and joint space narrowing by imaging studies.  There is no active infection.  Patient Active Problem List   Diagnosis Date Noted  . Primary localized osteoarthritis of left knee 03/12/2019    Priority: High  . Diabetes mellitus type 2 in nonobese ([place]) 05/11/2019  . Fracture of radial head, left, closed 10/29/2013  . Essential hypertension 07/01/2013  . Impotence of organic origin 06/30/2013  . Malignant neoplasm of prostate ([place]) 05/27/2012  . Myasthenia gravis without exacerbation ([place]) 01/11/2012   Past Medical History:  Diagnosis Date  . Cancer [facility])    prostate 2007 radation Rx  . Diabetes mellitus type 2 in nonobese ([place]) 05/11/2019  . Fracture of radial head, left, closed 10/29/2013  . GERD (gastroesophageal reflux disease)   . Hypertension   . Myasthenia gravis ([place])   . Primary localized osteoarthritis of left knee 03/12/2019  . Seasonal allergies     Past Surgical History:  Procedure Laterality Date  . APPENDECTOMY    . KNEE ARTHROSCOPY    . ORIF ELBOW FRACTURE Left 10/29/2013   Procedure:  LEFT RADIAL HEAD  ARTHROPLASTY;  Surgeon: Johnny Bridge, MD;  Location: [place];  Service: Orthopedics;  Laterality: Left;  . TONSILLECTOMY      No current facility-administered medications for this encounter.    Current Outpatient Medications  Medication Sig Dispense Refill Last Dose  . aspirin 81 MG chewable tablet Chew by mouth daily.   Past Week at Unknown time  . azaTHIOprine (IMURAN) 50 MG tablet Take 150 mg by mouth daily.    05/11/2019 at Unknown time  . Cholecalciferol (VITAMIN D3) 25 MCG (1000 UT) CAPS Take 1,000 Units by mouth daily.   Past Month at Unknown time  . fluticasone (FLONASE) 50 MCG/ACT nasal spray Place into both nostrils daily.   05/11/2019 at Unknown time  . gabapentin (NEURONTIN) 300 MG capsule Take 900 mg by mouth 3 (three) times daily.   Past Month at Unknown time  . insulin glargine (LANTUS) 100 UNIT/ML injection Inject 35 Units into the skin daily.   05/11/2019 at Unknown time  . ketotifen (ZADITOR) 0.025 % ophthalmic solution 1 drop 2 (two) times daily.   Past Week at Unknown time  . olmesartan (BENICAR) 20 MG tablet Take 20 mg by mouth daily.    05/11/2019 at Unknown time  . omeprazole (PRILOSEC) 20 MG capsule Take 20 mg by mouth daily.   05/11/2019 at Unknown time  . predniSONE (DELTASONE) 2.5 MG tablet Take 2.5 mg by mouth See admin instructions. MWF   05/11/2019 at Unknown time  Allergies  Allergen Reactions  . Nitroglycerin Anaphylaxis    Social History   Tobacco Use  . Smoking status: Never Smoker  Substance Use Topics  . Alcohol use: Yes    Alcohol/week: 2.0 standard drinks    Types: 2 Cans of beer per week    Comment: occ    Family History  Problem Relation Age of Onset  . Diabetes type II Father      Review of Systems  Constitutional: Negative.   HENT: Negative.   Eyes:  Negative.   Respiratory: Negative.   Cardiovascular: Negative.   Gastrointestinal: Negative.   Genitourinary: Negative.   Musculoskeletal: Positive for joint pain.  Skin: Negative.   Neurological: Negative.   Endo/Heme/Allergies: Negative.   Psychiatric/Behavioral: Negative.     Objective:  Physical Exam  Constitutional: He appears well-developed and well-nourished.  HENT:  Head: Normocephalic and atraumatic.  Eyes: Pupils are equal, round, and reactive to light. Conjunctivae are normal.  Neck: Neck supple.  Cardiovascular: Normal rate.  Respiratory: Effort normal.  GI: Soft.  Genitourinary:    Genitourinary Comments: Not pertinent to current symptomatology therefore not examined.     Vital signs in last 24 hours: BP: ()/()  Arterial Line BP: ()/()   Labs:   Estimated body mass index is 31.22 kg/m as calculated from the following:   Height as of 03/12/19: 5' 11.5" (1.816 m).   Weight as of 03/12/19: 103 kg.   Imaging Review Plain radiographs demonstrate severe degenerative joint disease of the left knee(s). The overall alignment issignificant valgus. The bone quality appears to be good for age and reported activity level.      Assessment/Plan:  End stage arthritis, left knee  Active Problems:   Primary localized osteoarthritis of left knee   Essential hypertension   Malignant neoplasm of prostate ([place])   Myasthenia gravis without exacerbation ([facility])   Diabetes mellitus type 2 in nonobese [facility])   The patient history, physical examination, clinical judgment of the provider and imaging studies are consistent with end stage degenerative joint disease of the left knee(s) and total knee arthroplasty is deemed medically necessary. The treatment options including medical management, injection therapy arthroscopy and arthroplasty were discussed at length. The risks and benefits of total knee arthroplasty were presented and reviewed. The risks due to aseptic loosening,  infection, stiffness, patella tracking problems, thromboembolic complications and other imponderables were discussed. The patient acknowledged the explanation, agreed to proceed with the plan and consent was signed. Patient is being admitted for inpatient treatment for surgery, pain control, PT, OT, prophylactic antibiotics, VTE prophylaxis, progressive ambulation and ADL's and discharge planning. The patient is planning to be discharged home with home health services    Anticipated LOS equal to or greater than 2 midnights due to - Age 65 and older with one or more of the following:  - Obesity  - Expected need for hospital services (PT, OT, Nursing) required for safe  discharge  - Anticipated need for postoperative skilled nursing care or inpatient rehab  - Active co-morbidities: None OR   - Unanticipated findings during/Post Surgery: None  - Patient is a high risk of re-admission due to: None

## 2019-05-13 DIAGNOSIS — I499 Cardiac arrhythmia, unspecified: Secondary | ICD-10-CM

## 2019-05-13 HISTORY — DX: Cardiac arrhythmia, unspecified: I49.9

## 2019-05-14 NOTE — Patient Instructions (Addendum)
YOU NEED TO HAVE A COVID 19 TEST ON__7/9_____ @_______ , THIS TEST MUST BE DONE BEFORE SURGERY, COME TO [place] ENTRANCE. ONCE YOUR COVID TEST IS COMPLETED, PLEASE BEGIN THE QUARANTINE INSTRUCTIONS AS OUTLINED IN YOUR HANDOUT.                CYPRUS KUANG    Your procedure is scheduled on: 05-25-2019   Report to [place]  Entrance  Report to [place]  at  5:30 AM      Call this number if you have problems the morning of surgery [phone]    Remember:  [place], NO [place].   NO SOLID FOOD AFTER MIDNIGHT THE NIGHT PRIOR TO SURGERY . NOTHING BY MOUTH EXCEPT CLEAR LIQUIDS UNTIL 4:15 AM.   PLEASE FINISH G2  DRINK PER SURGEON ORDER 3 HOURS PRIOR TO SCHEDULED SURGERY TIME WHICH NEEDS TO BE COMPLETED AT 4:15 AM.    CLEAR LIQUID DIET   Foods Allowed                                                                     Foods Excluded  Coffee and tea, regular and decaf                             liquids that you cannot  Plain Jell-O in any flavor                                             see through such as: Fruit ices (not with fruit pulp)                                     milk, soups, orange juice  Iced Popsicles                                    All solid food Carbonated beverages, regular and diet                                    Cranberry, grape and apple juices Sports drinks like Gatorade Lightly seasoned clear broth or consume(fat free) Sugar, honey syrup  Sample Menu Breakfast                                Lunch                                     Supper Cranberry juice                    Beef broth  Chicken broth Jell-O                                     Grape juice                           Apple juice Coffee or tea                        Jell-O                                      Popsicle                                                 Coffee or tea                        Coffee or tea  _____________________________________________________________________     Take these medicines the morning of surgery with A SIP OF WATER:   Gabapentin, prilosec,imuran  DO NOT TAKE ANY DIABETIC MEDICATIONS DAY OF YOUR SURGERY        How to Manage Your Diabetes Before and After Surgery  Why is it important to control my blood sugar before and after surgery? . Improving blood sugar levels before and after surgery helps healing and can limit problems. . A way of improving blood sugar control is eating a healthy diet by: o  Eating less sugar and carbohydrates o  Increasing activity/exercise o  Talking with your doctor about reaching your blood sugar goals . High blood sugars (greater than 180 mg/dL) can raise your risk of infections and slow your recovery, so you will need to focus on controlling your diabetes during the weeks before surgery. . Make sure that the doctor who takes care of your diabetes knows about your planned surgery including the date and location.  How do I manage my blood sugar before surgery? . Check your blood sugar at least 4 times a day, starting 2 days before surgery, to make sure that the level is not too high or low. o Check your blood sugar the morning of your surgery when you wake up and every 2 hours until you get to the Short Stay unit. . If your blood sugar is less than 70 mg/dL, you will need to treat for low blood sugar: o Do not take insulin. o Treat a low blood sugar (less than 70 mg/dL) with  cup of clear juice (cranberry or apple), 4 glucose tablets, OR glucose gel. o Recheck blood sugar in 15 minutes after treatment (to make sure it is greater than 70 mg/dL). If your blood sugar is not greater than 70 mg/dL on recheck, call [phone] for further instructions. . Report your blood sugar to the short stay nurse when you get to Short Stay.  . If you are admitted to the hospital  after surgery: o Your blood sugar will be checked by the staff and you will probably be given insulin after surgery (instead of oral diabetes medicines) to make sure you have good blood sugar levels. o The goal for blood sugar control after surgery is  80-180 mg/dL.   WHAT DO I DO ABOUT MY DIABETES MEDICATION?  Marland Kitchen Do not take oral diabetes medicines (pills) the morning of surgery.  . THE NIGHT BEFORE SURGERY, take 17.5    units of glargine       insulin.       . THE MORNING OF SURGERY, take17.5   units of  glargine        insulin.  . The day of surgery, do not take other diabetes injectables, including Byetta (exenatide), Bydureon (exenatide ER), Victoza (liraglutide), or Trulicity (dulaglutide).  . If your CBG is greater than 220 mg/dL, you may take  of your sliding scale  . (correction) dose of insulin.   Reviewed and Endorsed by [facility] Patient Education Committee, August 2015                          You may not have any metal on your body including hair pins and              piercings  Do not wear jewelry, make-up, lotions, powders or perfumes, deodorant                      Men may shave face and neck.   Do not bring valuables to the hospital. [place].  Contacts, dentures or bridgework may not be worn into surgery.                  Please read over the following fact sheets you were given: _____________________________________________________________________             [facility] - Preparing for Surgery Before surgery, you can play an important role.  Because skin is not sterile, your skin needs to be as free of germs as possible.  You can reduce the number of germs on your skin by washing with CHG (chlorahexidine gluconate) soap before surgery.  CHG is an antiseptic cleaner which kills germs and bonds with the skin to continue killing germs even after washing. Please DO NOT use if you have an allergy to CHG or  antibacterial soaps.  If your skin becomes reddened/irritated stop using the CHG and inform your nurse when you arrive at Short Stay. Do not shave (including legs and underarms) for at least 48 hours prior to the first CHG shower.  You may shave your face/neck. Please follow these instructions carefully:  1.  Shower with CHG Soap the night before surgery and the  morning of Surgery.  2.  If you choose to wash your hair, wash your hair first as usual with your  normal  shampoo.  3.  After you shampoo, rinse your hair and body thoroughly to remove the  shampoo.                             .              4.  Use CHG as you would any other liquid soap.  You can apply chg directly  to the skin and wash                       Gently with a scrungie or clean washcloth.  5.  Apply the CHG Soap to your body ONLY  FROM THE NECK DOWN.   Do not use on face/ open                           Wound or open sores. Avoid contact with eyes, ears mouth and genitals (private parts).                       Wash face,  Genitals (private parts) with your normal soap.             6.  Wash thoroughly, paying special attention to the area where your surgery  will be performed.  7.  Thoroughly rinse your body with warm water from the neck down.  8.  DO NOT shower/wash with your normal soap after using and rinsing off  the CHG Soap.                9.  Pat yourself dry with a clean towel.            10.  Wear clean pajamas.            11.  Place clean sheets on your bed the night of your first shower and do not  sleep with pets. Day of Surgery : Do not apply any lotions/deodorants the morning of surgery.  Please wear clean clothes to the hospital/surgery center.  FAILURE TO FOLLOW THESE INSTRUCTIONS MAY RESULT IN THE CANCELLATION OF YOUR SURGERY PATIENT SIGNATURE_________________________________  NURSE  SIGNATURE__________________________________  ________________________________________________________________________   Adam Phenix  An incentive spirometer is a tool that can help keep your lungs clear and active. This tool measures how well you are filling your lungs with each breath. Taking long deep breaths may help reverse or decrease the chance of developing breathing (pulmonary) problems (especially infection) following:  A long period of time when you are unable to move or be active. BEFORE THE PROCEDURE   If the spirometer includes an indicator to show your best effort, your nurse or respiratory therapist will set it to a desired goal.  If possible, sit up straight or lean slightly forward. Try not to slouch.  Hold the incentive spirometer in an upright position. INSTRUCTIONS FOR USE  1. Sit on the edge of your bed if possible, or sit up as far as you can in bed or on a chair. 2. Hold the incentive spirometer in an upright position. 3. Breathe out normally. 4. Place the mouthpiece in your mouth and seal your lips tightly around it. 5. Breathe in slowly and as deeply as possible, raising the piston or the ball toward the top of the column. 6. Hold your breath for 3-5 seconds or for as long as possible. Allow the piston or ball to fall to the bottom of the column. 7. Remove the mouthpiece from your mouth and breathe out normally. 8. Rest for a few seconds and repeat Steps 1 through 7 at least 10 times every 1-2 hours when you are awake. Take your time and take a few normal breaths between deep breaths. 9. The spirometer may include an indicator to show your best effort. Use the indicator as a goal to work toward during each repetition. 10. After each set of 10 deep breaths, practice coughing to be sure your lungs are clear. If you have an incision (the cut made at the time of surgery), support your incision when coughing by placing a pillow or rolled up towels firmly  against it. Once  you are able to get out of bed, walk around indoors and cough well. You may stop using the incentive spirometer when instructed by your caregiver.  RISKS AND COMPLICATIONS  Take your time so you do not get dizzy or light-headed.  If you are in pain, you may need to take or ask for pain medication before doing incentive spirometry. It is harder to take a deep breath if you are having pain. AFTER USE  Rest and breathe slowly and easily.  It can be helpful to keep track of a log of your progress. Your caregiver can provide you with a simple table to help with this. If you are using the spirometer at home, follow these instructions: [place] IF:   You are having difficultly using the spirometer.  You have trouble using the spirometer as often as instructed.  Your pain medication is not giving enough relief while using the spirometer.  You develop fever of 100.5 F (38.1 C) or higher. SEEK IMMEDIATE MEDICAL CARE IF:   You cough up bloody sputum that had not been present before.  You develop fever of 102 F (38.9 C) or greater.  You develop worsening pain at or near the incision site. MAKE SURE YOU:   Understand these instructions.  Will watch your condition.  Will get help right away if you are not doing well or get worse. Document Released: 03/11/2007 Document Revised: 01/21/2012 Document Reviewed: 05/12/2007 [facility] Patient Information 2014 [employer], [place].   ________________________________________________________________________ Zena Amos

## 2019-05-18 ENCOUNTER — Encounter (HOSPITAL_COMMUNITY): Payer: Self-pay

## 2019-05-18 ENCOUNTER — Encounter (HOSPITAL_COMMUNITY)
Admission: RE | Admit: 2019-05-18 | Discharge: 2019-05-18 | Disposition: A | Discharge status: Home / Self Care | Payer: Medicare Other | Source: Ambulatory Visit | Attending: Orthopedic Surgery | Admitting: Orthopedic Surgery

## 2019-05-18 ENCOUNTER — Encounter (INDEPENDENT_AMBULATORY_CARE_PROVIDER_SITE_OTHER): Payer: Self-pay

## 2019-05-18 ENCOUNTER — Other Ambulatory Visit: Payer: Self-pay

## 2019-05-18 DIAGNOSIS — Z01818 Encounter for other preprocedural examination: Secondary | ICD-10-CM | POA: Insufficient documentation

## 2019-05-18 DIAGNOSIS — Z923 Personal history of irradiation: Secondary | ICD-10-CM | POA: Diagnosis not present

## 2019-05-18 DIAGNOSIS — I10 Essential (primary) hypertension: Secondary | ICD-10-CM | POA: Diagnosis not present

## 2019-05-18 DIAGNOSIS — R9431 Abnormal electrocardiogram [ECG] [EKG]: Secondary | ICD-10-CM | POA: Diagnosis not present

## 2019-05-18 DIAGNOSIS — Z794 Long term (current) use of insulin: Secondary | ICD-10-CM | POA: Diagnosis not present

## 2019-05-18 DIAGNOSIS — Z1159 Encounter for screening for other viral diseases: Secondary | ICD-10-CM | POA: Insufficient documentation

## 2019-05-18 DIAGNOSIS — Z8546 Personal history of malignant neoplasm of prostate: Secondary | ICD-10-CM | POA: Insufficient documentation

## 2019-05-18 DIAGNOSIS — G7000 Myasthenia gravis without (acute) exacerbation: Secondary | ICD-10-CM | POA: Insufficient documentation

## 2019-05-18 DIAGNOSIS — Z7982 Long term (current) use of aspirin: Secondary | ICD-10-CM | POA: Diagnosis not present

## 2019-05-18 DIAGNOSIS — E119 Type 2 diabetes mellitus without complications: Secondary | ICD-10-CM | POA: Insufficient documentation

## 2019-05-18 DIAGNOSIS — K219 Gastro-esophageal reflux disease without esophagitis: Secondary | ICD-10-CM | POA: Diagnosis not present

## 2019-05-18 DIAGNOSIS — Z79899 Other long term (current) drug therapy: Secondary | ICD-10-CM | POA: Diagnosis not present

## 2019-05-18 DIAGNOSIS — M1712 Unilateral primary osteoarthritis, left knee: Secondary | ICD-10-CM | POA: Diagnosis not present

## 2019-05-18 LAB — COMPREHENSIVE METABOLIC PANEL
ALT: 23 U/L (ref 0–44)
AST: 36 U/L (ref 15–41)
Albumin: 3.6 g/dL (ref 3.5–5.0)
Alkaline Phosphatase: 107 U/L (ref 38–126)
Anion gap: 7 (ref 5–15)
BUN: 11 mg/dL (ref 8–23)
CO2: 27 mmol/L (ref 22–32)
Calcium: 9.2 mg/dL (ref 8.9–10.3)
Chloride: 108 mmol/L (ref 98–111)
Creatinine, Ser: 0.87 mg/dL (ref 0.61–1.24)
GFR calc Af Amer: >60 mL/min (ref >60)
GFR calc non Af Amer: >60 mL/min (ref >60)
Glucose, Bld: 120 mg/dL — ABNORMAL HIGH (ref 70–99)
Potassium: 4.8 mmol/L (ref 3.5–5.1)
Sodium: 142 mmol/L (ref 135–145)
Total Bilirubin: 0.6 mg/dL (ref 0.3–1.2)
Total Protein: 7.3 g/dL (ref 6.5–8.1)

## 2019-05-18 LAB — HEMOGLOBIN A1C
Hgb A1c MFr Bld: 6.8 % — ABNORMAL HIGH (ref 4.8–5.6)
Mean Plasma Glucose: 148.46 mg/dL

## 2019-05-18 LAB — DIFFERENTIAL
Abs Immature Granulocytes: 0.03 10*3/uL (ref 0.00–0.07)
Basophils Absolute: 0 10*3/uL (ref 0.0–0.1)
Basophils Relative: 1 %
Eosinophils Absolute: 0.1 10*3/uL (ref 0.0–0.5)
Eosinophils Relative: 1 %
Immature Granulocytes: 0 %
Lymphocytes Relative: 9 %
Lymphs Abs: 0.7 10*3/uL (ref 0.7–4.0)
Monocytes Absolute: 0.8 10*3/uL (ref 0.1–1.0)
Monocytes Relative: 10 %
Neutro Abs: 6.4 10*3/uL (ref 1.7–7.7)
Neutrophils Relative %: 79 %

## 2019-05-18 LAB — APTT: aPTT: 32 seconds (ref 24–36)

## 2019-05-18 LAB — CBC
HCT: 41.3 % (ref 39.0–52.0)
Hemoglobin: 13.1 g/dL (ref 13.0–17.0)
MCH: 35 pg — ABNORMAL HIGH (ref 26.0–34.0)
MCHC: 31.7 g/dL (ref 30.0–36.0)
MCV: 110.4 fL — ABNORMAL HIGH (ref 80.0–100.0)
Platelets: 177 10*3/uL (ref 150–400)
RBC: 3.74 MIL/uL — ABNORMAL LOW (ref 4.22–5.81)
RDW: 14.8 % (ref 11.5–15.5)
WBC: 8.1 10*3/uL (ref 4.0–10.5)
nRBC: 0 % (ref 0.0–0.2)

## 2019-05-18 LAB — SURGICAL PCR SCREEN
MRSA, PCR: NEGATIVE
Staphylococcus aureus: NEGATIVE

## 2019-05-18 LAB — PROTIME-INR
INR: 1.1 (ref 0.8–1.2)
Prothrombin Time: 13.9 seconds (ref 11.4–15.2)

## 2019-05-18 NOTE — Progress Notes (Signed)
Gary Felix PA  Dr. Heber Van Alstyne manages the Pt's ASA. Dr. Para March instructed pt to stop it 05/13/19

## 2019-05-19 LAB — URINE CULTURE: Culture: NO GROWTH

## 2019-05-19 NOTE — Progress Notes (Signed)
Anesthesia Chart Review   Case: [id number] Date/Time: 05/25/19 0700   Procedure: TOTAL KNEE ARTHROPLASTY (Left )   Anesthesia type: Spinal   Pre-op diagnosis: djd left knee   Location: [facility] ROOM 07 / [facility]   Surgeon: Elsie Saas, MD     er week DISCUSSION:83 y.o. never smoker with h/o HTN, GERD, DM II, myasthenia gravis (in pharmacologic remission on 2.5 mg prednisone MWF along with Azathioiprine 150mg  daily), PVCs, left knee DJD scheduled for above procedure 05/25/2019 with Dr. Elsie Saas.    Last seen by neurologist regarding myasthenia gravis 02/04/2019.  Per OV note, "Since last visit, he reports he is doing well.  He specifically denies diplopia, pstosis, difficulty with chewing or swallowing, dysnpea, or extremity weakness."  1 year follow up recommended.  Cleared for upcoming surgery, clearance faxed to surgeon, requested.   Anticipate pt can proceed with planned procedure barring acute status change.   VS: BP 118/75 (BP Location: Right Arm)   Pulse (!) 58   Temp 37.1 C (Oral)   Resp 18   Ht 5\' 11"  (1.803 m)   Wt 99 kg   SpO2 98%   BMI 30.43 kg/m   PROVIDERS: Raelene Bott, MD is PCP   Debbe Mounts, MD is Neurologist  LABS: Labs reviewed: Acceptable for surgery. (all labs ordered are listed, but only abnormal results are displayed)  Labs Reviewed  COMPREHENSIVE METABOLIC PANEL - Abnormal; Notable for the following components:      Result Value   Glucose, Bld 120 (*)    All other components within normal limits  CBC - Abnormal; Notable for the following components:   RBC 3.74 (*)    MCV 110.4 (*)    MCH 35.0 (*)    All other components within normal limits  HEMOGLOBIN A1C - Abnormal; Notable for the following components:   Hgb A1c MFr Bld 6.8 (*)    All other components within normal limits  URINE CULTURE  SURGICAL PCR SCREEN  DIFFERENTIAL  PROTIME-INR  APTT     IMAGES:   EKG: 05/18/2019 Rate 67 bpm Sinus rhythm with occasional and consecutive  Premature ventricular complexes Abnormal ECG Since previous EKG ectopy is now present  CV:  Past Medical History:  Diagnosis Date  . Cancer [facility])    prostate 2007 radation Rx  . Diabetes mellitus type 2 in nonobese ([place]) 05/11/2019  . Dysrhythmia 05/2019   PVC  . Fracture of radial head, left, closed 10/29/2013  . GERD (gastroesophageal reflux disease)   . Hypertension   . Myasthenia gravis ([place])   . Primary localized osteoarthritis of left knee 03/12/2019  . Seasonal allergies     Past Surgical History:  Procedure Laterality Date  . APPENDECTOMY    . KNEE ARTHROSCOPY    . ORIF ELBOW FRACTURE Left 10/29/2013   Procedure:  LEFT RADIAL HEAD  ARTHROPLASTY;  Surgeon: Johnny Bridge, MD;  Location: [place];  Service: Orthopedics;  Laterality: Left;  . TONSILLECTOMY      MEDICATIONS: . aspirin 81 MG EC tablet  . azaTHIOprine (IMURAN) 50 MG tablet  . Azelastine-Fluticasone (DYMISTA) 137-50 MCG/ACT SUSP  . gabapentin (NEURONTIN) 300 MG capsule  . insulin glargine (LANTUS) 100 UNIT/ML injection  . ketotifen (ALAWAY) 0.025 % ophthalmic solution  . olmesartan (BENICAR) 20 MG tablet  . omeprazole (PRILOSEC) 20 MG capsule  . predniSONE (DELTASONE) 2.5 MG tablet   No current facility-administered medications for this encounter.     [name], PA-C [facility]  Pre-Surgical Testing [phone] 05/19/19  1:46 PM

## 2019-05-19 NOTE — Anesthesia Preprocedure Evaluation (Addendum)
Anesthesia Evaluation  Patient identified by MRN, date of birth, ID band Patient awake    Reviewed: Allergy & Precautions, NPO status , Patient's Chart, lab work & pertinent test results  History of Anesthesia Complications Negative for: history of anesthetic complications  Airway Mallampati: II  TM Distance: >3 FB Neck ROM: Full    Dental  (+) Dental Advisory Given, Implants   Pulmonary neg pulmonary ROS,  05/21/2019 SARS coronavirus NEG   breath sounds clear to auscultation       Cardiovascular hypertension, Pt. on medications negative cardio ROS   Rhythm:Regular Rate:Normal     Neuro/Psych Myasthenia gravis    GI/Hepatic Neg liver ROS, GERD  Medicated and Controlled,  Endo/Other  diabetes (glu 164), Insulin Dependent  Renal/GU    Prostate cancer    Musculoskeletal   Abdominal   Peds  Hematology negative hematology ROS (+)   Anesthesia Other Findings   Reproductive/Obstetrics                           Anesthesia Physical Anesthesia Plan  ASA: III  Anesthesia Plan: Spinal   Post-op Pain Management:  [facility] for Post-op pain   Induction:   PONV Risk Score and Plan: 1 and Ondansetron and Dexamethasone  Airway Management Planned: Natural Airway and Simple Face Mask  Additional Equipment:   Intra-op Plan:   Post-operative Plan:   Informed Consent: I have reviewed the patients History and Physical, chart, labs and discussed the procedure including the risks, benefits and alternatives for the proposed anesthesia with the patient or authorized representative who has indicated his/her understanding and acceptance.     Dental advisory given  Plan Discussed with: CRNA and Surgeon  Anesthesia Plan Comments: (See PAT note 05/18/2019, [name], PA-C Plan routine monitors, SAB with adductor canal block for post op analgesia)      Anesthesia Quick Evaluation

## 2019-05-21 ENCOUNTER — Other Ambulatory Visit: Payer: Self-pay | Admitting: Orthopedic Surgery

## 2019-05-21 ENCOUNTER — Other Ambulatory Visit (HOSPITAL_COMMUNITY)
Admission: RE | Admit: 2019-05-21 | Discharge: 2019-05-21 | Disposition: A | Discharge status: Home / Self Care | Payer: Medicare Other | Source: Ambulatory Visit | Attending: Orthopedic Surgery | Admitting: Orthopedic Surgery

## 2019-05-21 DIAGNOSIS — Z01818 Encounter for other preprocedural examination: Secondary | ICD-10-CM | POA: Diagnosis not present

## 2019-05-21 NOTE — Care Plan (Signed)
Spoke with patient prior to surgery. He plans to discharge to home with wife and HHPT. Referral to [facility]. equipment ordered.  Patient and MD agreeable with plan. Choice offered.   Ladell Heads, [place]

## 2019-05-22 LAB — SARS CORONAVIRUS 2 (TAT 6-24 HRS): SARS Coronavirus 2: NEGATIVE

## 2019-05-22 NOTE — Progress Notes (Signed)
Patient called and informed surgery changed back to 0715 on 05/25/19. To arrive 0530. Eras drink completed by 0415 that morning.

## 2019-05-22 NOTE — Progress Notes (Signed)
Pt aware to be here at 0800, not 0530

## 2019-05-24 MED ORDER — BUPIVACAINE LIPOSOME 1.3 % IJ SUSP
20.0000 mL | INTRAMUSCULAR | Status: DC
  Filled 2019-05-24: qty 20

## 2019-05-25 ENCOUNTER — Encounter (HOSPITAL_COMMUNITY): Payer: Self-pay | Admitting: Emergency Medicine

## 2019-05-25 ENCOUNTER — Inpatient Hospital Stay (HOSPITAL_COMMUNITY)
Admission: RE | Admit: 2019-05-25 | Discharge: 2019-05-27 | DRG: 470 | Disposition: A | Discharge status: Home Health | Payer: Medicare Other | Attending: Orthopedic Surgery | Admitting: Orthopedic Surgery

## 2019-05-25 ENCOUNTER — Encounter (HOSPITAL_COMMUNITY)
Admission: RE | Disposition: A | Discharge status: Home Health | Payer: Self-pay | Source: Home / Self Care | Attending: Orthopedic Surgery

## 2019-05-25 ENCOUNTER — Other Ambulatory Visit: Payer: Self-pay

## 2019-05-25 ENCOUNTER — Inpatient Hospital Stay (HOSPITAL_COMMUNITY): Payer: Medicare Other | Admitting: Anesthesiology

## 2019-05-25 ENCOUNTER — Inpatient Hospital Stay (HOSPITAL_COMMUNITY): Payer: Medicare Other | Admitting: Physician Assistant

## 2019-05-25 DIAGNOSIS — C61 Malignant neoplasm of prostate: Secondary | ICD-10-CM | POA: Diagnosis present

## 2019-05-25 DIAGNOSIS — M25562 Pain in left knee: Secondary | ICD-10-CM

## 2019-05-25 DIAGNOSIS — J302 Other seasonal allergic rhinitis: Secondary | ICD-10-CM | POA: Diagnosis present

## 2019-05-25 DIAGNOSIS — E119 Type 2 diabetes mellitus without complications: Secondary | ICD-10-CM | POA: Diagnosis present

## 2019-05-25 DIAGNOSIS — Z6830 Body mass index (BMI) 30.0-30.9, adult: Secondary | ICD-10-CM

## 2019-05-25 DIAGNOSIS — E669 Obesity, unspecified: Secondary | ICD-10-CM | POA: Diagnosis present

## 2019-05-25 DIAGNOSIS — K219 Gastro-esophageal reflux disease without esophagitis: Secondary | ICD-10-CM | POA: Diagnosis present

## 2019-05-25 DIAGNOSIS — G7000 Myasthenia gravis without (acute) exacerbation: Secondary | ICD-10-CM | POA: Diagnosis present

## 2019-05-25 DIAGNOSIS — M1712 Unilateral primary osteoarthritis, left knee: Secondary | ICD-10-CM | POA: Diagnosis present

## 2019-05-25 DIAGNOSIS — Z794 Long term (current) use of insulin: Secondary | ICD-10-CM | POA: Diagnosis not present

## 2019-05-25 DIAGNOSIS — Z833 Family history of diabetes mellitus: Secondary | ICD-10-CM | POA: Diagnosis not present

## 2019-05-25 DIAGNOSIS — I10 Essential (primary) hypertension: Secondary | ICD-10-CM | POA: Diagnosis present

## 2019-05-25 DIAGNOSIS — Z7982 Long term (current) use of aspirin: Secondary | ICD-10-CM

## 2019-05-25 DIAGNOSIS — Z8546 Personal history of malignant neoplasm of prostate: Secondary | ICD-10-CM

## 2019-05-25 DIAGNOSIS — Z7952 Long term (current) use of systemic steroids: Secondary | ICD-10-CM | POA: Diagnosis not present

## 2019-05-25 HISTORY — PX: TOTAL KNEE ARTHROPLASTY: SHX125

## 2019-05-25 LAB — GLUCOSE, CAPILLARY
Glucose-Capillary: 164 mg/dL — ABNORMAL HIGH (ref 70–99)
Glucose-Capillary: 131 mg/dL — ABNORMAL HIGH (ref 70–99)
Glucose-Capillary: 139 mg/dL — ABNORMAL HIGH (ref 70–99)
Glucose-Capillary: 154 mg/dL — ABNORMAL HIGH (ref 70–99)
Glucose-Capillary: 199 mg/dL — ABNORMAL HIGH (ref 70–99)
Glucose-Capillary: 189 mg/dL — ABNORMAL HIGH (ref 70–99)

## 2019-05-25 SURGERY — ARTHROPLASTY, KNEE, TOTAL
Anesthesia: Spinal | Laterality: Left

## 2019-05-25 MED ORDER — POLYETHYLENE GLYCOL 3350 17 G PO PACK
17.0000 g | PACK | Freq: Two times a day (BID) | ORAL | Status: DC
  Administered 2019-05-25 – 2019-05-27 (×2): 17 g via ORAL
  Filled 2019-05-25 (×3): qty 1

## 2019-05-25 MED ORDER — 0.9 % SODIUM CHLORIDE (POUR BTL) OPTIME
TOPICAL | Status: DC | PRN
  Administered 2019-05-25: 1000 mL

## 2019-05-25 MED ORDER — CHLORHEXIDINE GLUCONATE 4 % EX LIQD: 60.0000 mL | Freq: Once | CUTANEOUS | Status: DC

## 2019-05-25 MED ORDER — PROPOFOL 10 MG/ML IV BOLUS
INTRAVENOUS | Status: AC
  Filled 2019-05-25: qty 20

## 2019-05-25 MED ORDER — CEFAZOLIN SODIUM-DEXTROSE 2-4 GM/100ML-% IV SOLN
2.0000 g | Freq: Four times a day (QID) | INTRAVENOUS | Status: AC
  Administered 2019-05-25 (×2): 2 g via INTRAVENOUS
  Filled 2019-05-25 (×2): qty 100

## 2019-05-25 MED ORDER — DOCUSATE SODIUM 100 MG PO CAPS
100.0000 mg | ORAL_CAPSULE | Freq: Two times a day (BID) | ORAL | Status: DC
  Administered 2019-05-25 – 2019-05-27 (×3): 100 mg via ORAL
  Filled 2019-05-25 (×4): qty 1

## 2019-05-25 MED ORDER — PROPOFOL 500 MG/50ML IV EMUL
INTRAVENOUS | Status: DC | PRN
  Administered 2019-05-25: 75 ug/kg/min via INTRAVENOUS

## 2019-05-25 MED ORDER — CEFAZOLIN SODIUM-DEXTROSE 2-3 GM-%(50ML) IV SOLR
INTRAVENOUS | Status: DC | PRN
  Administered 2019-05-25: 2 g via INTRAVENOUS

## 2019-05-25 MED ORDER — ROPIVACAINE HCL 7.5 MG/ML IJ SOLN
INTRAMUSCULAR | Status: DC | PRN
  Administered 2019-05-25: 20 mL via PERINEURAL

## 2019-05-25 MED ORDER — LACTATED RINGERS IV SOLN
INTRAVENOUS | Status: DC
  Administered 2019-05-25 (×2): via INTRAVENOUS

## 2019-05-25 MED ORDER — FENTANYL CITRATE (PF) 100 MCG/2ML IJ SOLN
INTRAMUSCULAR | Status: DC | PRN
  Administered 2019-05-25 (×2): 50 ug via INTRAVENOUS

## 2019-05-25 MED ORDER — EPHEDRINE SULFATE 50 MG/ML IJ SOLN
INTRAMUSCULAR | Status: DC | PRN
  Administered 2019-05-25: 5 mg via INTRAVENOUS

## 2019-05-25 MED ORDER — POVIDONE-IODINE 10 % EX SWAB
2.0000 "application " | Freq: Once | CUTANEOUS | Status: AC
  Administered 2019-05-25: 2 via TOPICAL

## 2019-05-25 MED ORDER — DEXAMETHASONE SODIUM PHOSPHATE 10 MG/ML IJ SOLN
INTRAMUSCULAR | Status: DC | PRN
  Administered 2019-05-25: 10 mg via INTRAVENOUS

## 2019-05-25 MED ORDER — MENTHOL 3 MG MT LOZG: 1.0000 | LOZENGE | OROMUCOSAL | Status: DC | PRN

## 2019-05-25 MED ORDER — ACETAMINOPHEN 500 MG PO TABS
1000.0000 mg | ORAL_TABLET | Freq: Four times a day (QID) | ORAL | Status: AC
  Administered 2019-05-25 – 2019-05-26 (×4): 1000 mg via ORAL
  Filled 2019-05-25 (×4): qty 2

## 2019-05-25 MED ORDER — AZATHIOPRINE 50 MG PO TABS
150.0000 mg | ORAL_TABLET | Freq: Every day | ORAL | Status: DC
  Administered 2019-05-26 – 2019-05-27 (×2): 150 mg via ORAL
  Filled 2019-05-25 (×2): qty 3

## 2019-05-25 MED ORDER — POTASSIUM CHLORIDE IN NACL 20-0.9 MEQ/L-% IV SOLN
INTRAVENOUS | Status: DC
  Administered 2019-05-25 – 2019-05-26 (×3): via INTRAVENOUS
  Filled 2019-05-25 (×3): qty 1000

## 2019-05-25 MED ORDER — ONDANSETRON HCL 4 MG/2ML IJ SOLN: 4.0000 mg | Freq: Four times a day (QID) | INTRAMUSCULAR | Status: DC | PRN

## 2019-05-25 MED ORDER — METOCLOPRAMIDE HCL 5 MG/ML IJ SOLN: 5.0000 mg | Freq: Three times a day (TID) | INTRAMUSCULAR | Status: DC | PRN

## 2019-05-25 MED ORDER — SODIUM CHLORIDE 0.9 % IJ SOLN
INTRAMUSCULAR | Status: DC | PRN
  Administered 2019-05-25: 50 mL

## 2019-05-25 MED ORDER — HYDROMORPHONE HCL 1 MG/ML IJ SOLN
0.5000 mg | INTRAMUSCULAR | Status: DC | PRN
  Administered 2019-05-26: 0.5 mg via INTRAVENOUS
  Filled 2019-05-25: qty 1

## 2019-05-25 MED ORDER — FENTANYL CITRATE (PF) 100 MCG/2ML IJ SOLN
INTRAMUSCULAR | Status: AC
  Filled 2019-05-25: qty 2

## 2019-05-25 MED ORDER — CEFUROXIME SODIUM 1.5 G IV SOLR
INTRAVENOUS | Status: AC
  Filled 2019-05-25: qty 1.5

## 2019-05-25 MED ORDER — TRANEXAMIC ACID-NACL 1000-0.7 MG/100ML-% IV SOLN
1000.0000 mg | INTRAVENOUS | Status: AC
  Administered 2019-05-25: 08:00:00 1000 mg via INTRAVENOUS
  Filled 2019-05-25: qty 100

## 2019-05-25 MED ORDER — INSULIN ASPART 100 UNIT/ML ~~LOC~~ SOLN: 0.0000 [IU] | Freq: Every day | SUBCUTANEOUS | Status: DC

## 2019-05-25 MED ORDER — ASPIRIN EC 325 MG PO TBEC
325.0000 mg | DELAYED_RELEASE_TABLET | Freq: Every day | ORAL | Status: DC
  Administered 2019-05-26 – 2019-05-27 (×2): 325 mg via ORAL
  Filled 2019-05-25 (×2): qty 1

## 2019-05-25 MED ORDER — FENTANYL CITRATE (PF) 100 MCG/2ML IJ SOLN: 25.0000 ug | INTRAMUSCULAR | Status: DC | PRN

## 2019-05-25 MED ORDER — CEFAZOLIN SODIUM-DEXTROSE 2-4 GM/100ML-% IV SOLN
INTRAVENOUS | Status: AC
  Filled 2019-05-25: qty 100

## 2019-05-25 MED ORDER — ALUM & MAG HYDROXIDE-SIMETH 200-200-20 MG/5ML PO SUSP: 30.0000 mL | ORAL | Status: DC | PRN

## 2019-05-25 MED ORDER — GABAPENTIN 300 MG PO CAPS
600.0000 mg | ORAL_CAPSULE | Freq: Three times a day (TID) | ORAL | Status: DC
  Administered 2019-05-25 – 2019-05-27 (×5): 600 mg via ORAL
  Filled 2019-05-25 (×5): qty 2

## 2019-05-25 MED ORDER — PHENOL 1.4 % MT LIQD
1.0000 | OROMUCOSAL | Status: DC | PRN
  Filled 2019-05-25: qty 177

## 2019-05-25 MED ORDER — PROPOFOL 500 MG/50ML IV EMUL
INTRAVENOUS | Status: DC | PRN
  Administered 2019-05-25: 30 mg via INTRAVENOUS

## 2019-05-25 MED ORDER — SODIUM CHLORIDE 0.9 % IR SOLN
Status: DC | PRN
  Administered 2019-05-25: 1000 mL

## 2019-05-25 MED ORDER — POVIDONE-IODINE 10 % EX SWAB: 2.0000 "application " | Freq: Once | CUTANEOUS | Status: DC

## 2019-05-25 MED ORDER — DEXAMETHASONE SODIUM PHOSPHATE 10 MG/ML IJ SOLN
10.0000 mg | Freq: Three times a day (TID) | INTRAMUSCULAR | Status: AC
  Administered 2019-05-25 – 2019-05-26 (×4): 10 mg via INTRAVENOUS
  Filled 2019-05-25 (×4): qty 1

## 2019-05-25 MED ORDER — BUPIVACAINE-EPINEPHRINE 0.25% -1:200000 IJ SOLN
INTRAMUSCULAR | Status: DC | PRN
  Administered 2019-05-25: 30 mL

## 2019-05-25 MED ORDER — DIPHENHYDRAMINE HCL 12.5 MG/5ML PO ELIX
12.5000 mg | ORAL_SOLUTION | ORAL | Status: DC | PRN
  Administered 2019-05-26: 25 mg via ORAL
  Filled 2019-05-25: qty 10

## 2019-05-25 MED ORDER — SODIUM CHLORIDE (PF) 0.9 % IJ SOLN
INTRAMUSCULAR | Status: AC
  Filled 2019-05-25: qty 50

## 2019-05-25 MED ORDER — BUPIVACAINE LIPOSOME 1.3 % IJ SUSP
20.0000 mL | Freq: Once | INTRAMUSCULAR | Status: DC
  Filled 2019-05-25: qty 20

## 2019-05-25 MED ORDER — BUPIVACAINE LIPOSOME 1.3 % IJ SUSP
INTRAMUSCULAR | Status: DC | PRN
  Administered 2019-05-25: 20 mL

## 2019-05-25 MED ORDER — PROPOFOL 10 MG/ML IV BOLUS
INTRAVENOUS | Status: AC
  Filled 2019-05-25: qty 60

## 2019-05-25 MED ORDER — OXYCODONE HCL 5 MG PO TABS
5.0000 mg | ORAL_TABLET | ORAL | Status: DC | PRN
  Administered 2019-05-25 – 2019-05-27 (×6): 10 mg via ORAL
  Filled 2019-05-25 (×7): qty 2

## 2019-05-25 MED ORDER — INSULIN ASPART 100 UNIT/ML ~~LOC~~ SOLN
0.0000 [IU] | Freq: Three times a day (TID) | SUBCUTANEOUS | Status: DC
  Administered 2019-05-26 (×3): 4 [IU] via SUBCUTANEOUS
  Administered 2019-05-27: 3 [IU] via SUBCUTANEOUS

## 2019-05-25 MED ORDER — WATER FOR IRRIGATION, STERILE IR SOLN
Status: DC | PRN
  Administered 2019-05-25: 2000 mL

## 2019-05-25 MED ORDER — POVIDONE-IODINE 7.5 % EX SOLN
Freq: Once | CUTANEOUS | Status: AC
  Administered 2019-05-25: 06:00:00 via TOPICAL

## 2019-05-25 MED ORDER — METOCLOPRAMIDE HCL 5 MG PO TABS: 5.0000 mg | ORAL_TABLET | Freq: Three times a day (TID) | ORAL | Status: DC | PRN

## 2019-05-25 MED ORDER — BUPIVACAINE-EPINEPHRINE (PF) 0.25% -1:200000 IJ SOLN
INTRAMUSCULAR | Status: AC
  Filled 2019-05-25: qty 30

## 2019-05-25 MED ORDER — INSULIN GLARGINE 100 UNIT/ML ~~LOC~~ SOLN
35.0000 [IU] | Freq: Every day | SUBCUTANEOUS | Status: DC
  Administered 2019-05-25 – 2019-05-27 (×3): 35 [IU] via SUBCUTANEOUS
  Filled 2019-05-25 (×3): qty 0.35

## 2019-05-25 MED ORDER — DEXAMETHASONE SODIUM PHOSPHATE 10 MG/ML IJ SOLN: 8.0000 mg | Freq: Once | INTRAMUSCULAR | Status: DC

## 2019-05-25 MED ORDER — PANTOPRAZOLE SODIUM 40 MG PO TBEC
40.0000 mg | DELAYED_RELEASE_TABLET | Freq: Every day | ORAL | Status: DC
  Administered 2019-05-26 – 2019-05-27 (×2): 40 mg via ORAL
  Filled 2019-05-25 (×2): qty 1

## 2019-05-25 MED ORDER — ONDANSETRON HCL 4 MG/2ML IJ SOLN
INTRAMUSCULAR | Status: DC | PRN
  Administered 2019-05-25: 4 mg via INTRAVENOUS

## 2019-05-25 MED ORDER — ONDANSETRON HCL 4 MG PO TABS: 4.0000 mg | ORAL_TABLET | Freq: Four times a day (QID) | ORAL | Status: DC | PRN

## 2019-05-25 SURGICAL SUPPLY — 71 items
APL PRP STRL LF DISP 70% ISPRP (MISCELLANEOUS) ×2
ATTUNE MED DOME PAT 38 KNEE (Knees) ×1 IMPLANT
ATTUNE MED DOME PAT 38MM KNEE (Knees) ×1 IMPLANT
ATTUNE PS FEM LT SZ 7 CEM KNEE (Femur) ×2 IMPLANT
ATTUNE PSRP INSR SZ7 7 KNEE (Insert) ×1 IMPLANT
ATTUNE PSRP INSR SZ7 7MM KNEE (Insert) ×1 IMPLANT
BAG SPEC THK2 15X12 ZIP CLS (MISCELLANEOUS) ×1
BAG ZIPLOCK 12X15 (MISCELLANEOUS) ×3 IMPLANT
BASE TIBIAL ROT PLAT SZ 8 KNEE (Knees) IMPLANT
BLADE SAGITTAL 25.0X1.19X90 (BLADE) ×2 IMPLANT
BLADE SAGITTAL 25.0X1.19X90MM (BLADE) ×1
BLADE SAW SGTL 13X75X1.27 (BLADE) ×3 IMPLANT
BNDG CMPR MED 10X6 ELC LF (GAUZE/BANDAGES/DRESSINGS) ×1
BNDG ELASTIC 6X10 VLCR STRL LF (GAUZE/BANDAGES/DRESSINGS) ×3 IMPLANT
BOWL SMART MIX CTS (DISPOSABLE) ×3 IMPLANT
BSPLAT TIB 8 CMNT ROT PLAT STR (Knees) ×1 IMPLANT
CEMENT HV SMART SET (Cement) ×6 IMPLANT
CHLORAPREP W/TINT 26 (MISCELLANEOUS) ×6 IMPLANT
CLOSURE WOUND 1/2 X4 (GAUZE/BANDAGES/DRESSINGS) ×1
COVER SURGICAL LIGHT HANDLE (MISCELLANEOUS) ×3 IMPLANT
COVER WAND RF STERILE (DRAPES) IMPLANT
CUFF TOURN SGL QUICK 34 (TOURNIQUET CUFF) ×3
CUFF TRNQT CYL 34X4.125X (TOURNIQUET CUFF) ×1 IMPLANT
DECANTER SPIKE VIAL GLASS SM (MISCELLANEOUS) ×6 IMPLANT
DRAPE ORTHO SPLIT 77X108 STRL (DRAPES) ×3
DRAPE SHEET LG 3/4 BI-LAMINATE (DRAPES) ×3 IMPLANT
DRAPE SURG ORHT 6 SPLT 77X108 (DRAPES) ×1 IMPLANT
DRAPE U-SHAPE 47X51 STRL (DRAPES) ×3 IMPLANT
DRESSING AQUACEL AG SP 3.5X10 (GAUZE/BANDAGES/DRESSINGS) IMPLANT
DRSG AQUACEL AG ADV 3.5X10 (GAUZE/BANDAGES/DRESSINGS) ×3 IMPLANT
DRSG AQUACEL AG SP 3.5X10 (GAUZE/BANDAGES/DRESSINGS) ×3
DRSG MEPILEX BORDER 4X4 (GAUZE/BANDAGES/DRESSINGS) ×3 IMPLANT
ELECT CAUTERY BLADE TIP 2.5 (TIP) ×3
ELECT REM PT RETURN 15FT ADLT (MISCELLANEOUS) ×3 IMPLANT
ELECTRODE CAUTERY BLDE TIP 2.5 (TIP) ×1 IMPLANT
GLOVE BIOGEL PI IND STRL 7.5 (GLOVE) ×1 IMPLANT
GLOVE BIOGEL PI IND STRL 8.5 (GLOVE) ×1 IMPLANT
GLOVE BIOGEL PI INDICATOR 7.5 (GLOVE) ×2
GLOVE BIOGEL PI INDICATOR 8.5 (GLOVE) ×2
GLOVE ECLIPSE 8.5 STRL (GLOVE) ×3 IMPLANT
GLOVE SS BIOGEL STRL SZ 7.5 (GLOVE) ×1 IMPLANT
GLOVE SUPERSENSE BIOGEL SZ 7.5 (GLOVE) ×2
GOWN STRL REUS W/ TWL XL LVL3 (GOWN DISPOSABLE) ×1 IMPLANT
GOWN STRL REUS W/TWL 2XL LVL3 (GOWN DISPOSABLE) ×3 IMPLANT
GOWN STRL REUS W/TWL XL LVL3 (GOWN DISPOSABLE) ×3
HANDPIECE INTERPULSE COAX TIP (DISPOSABLE) ×3
HOLDER FOLEY CATH W/STRAP (MISCELLANEOUS) ×2 IMPLANT
HOOD PEEL AWAY FLYTE STAYCOOL (MISCELLANEOUS) ×9 IMPLANT
KIT TURNOVER KIT A (KITS) ×3 IMPLANT
MANIFOLD NEPTUNE II (INSTRUMENTS) ×3 IMPLANT
MARKER SKIN DUAL TIP RULER LAB (MISCELLANEOUS) ×3 IMPLANT
NEEDLE HYPO 22GX1.5 SAFETY (NEEDLE) ×3 IMPLANT
NS IRRIG 1000ML POUR BTL (IV SOLUTION) ×3 IMPLANT
PACK TOTAL KNEE CUSTOM (KITS) ×3 IMPLANT
PIN STEINMAN FIXATION KNEE (PIN) ×2 IMPLANT
PIN THREADED HEADED SIGMA (PIN) ×2 IMPLANT
PROTECTOR NERVE ULNAR (MISCELLANEOUS) ×3 IMPLANT
SET HNDPC FAN SPRY TIP SCT (DISPOSABLE) ×1 IMPLANT
STAPLER VISISTAT 35W (STAPLE) IMPLANT
STRIP CLOSURE SKIN 1/2X4 (GAUZE/BANDAGES/DRESSINGS) ×2 IMPLANT
SUT MNCRL AB 3-0 PS2 18 (SUTURE) ×3 IMPLANT
SUT VIC AB 0 CT1 36 (SUTURE) ×6 IMPLANT
SUT VIC AB 1 CT1 36 (SUTURE) ×3 IMPLANT
SUT VIC AB 2-0 CT1 27 (SUTURE) ×6
SUT VIC AB 2-0 CT1 TAPERPNT 27 (SUTURE) ×2 IMPLANT
SYR CONTROL 10ML LL (SYRINGE) ×6 IMPLANT
TIBIAL BASE ROT PLAT SZ 8 KNEE (Knees) ×3 IMPLANT
TRAY FOLEY MTR SLVR 14FR STAT (SET/KITS/TRAYS/PACK) ×3 IMPLANT
WATER STERILE IRR 1000ML POUR (IV SOLUTION) ×6 IMPLANT
YANKAUER SUCT BULB TIP 10FT TU (MISCELLANEOUS) ×3 IMPLANT
YANKAUER SUCT BULB TIP NO VENT (SUCTIONS) ×3 IMPLANT

## 2019-05-25 NOTE — Evaluation (Signed)
Physical Therapy Evaluation Patient Details Name: Gary Sherman MRN: [id number] DOB: 1936/07/20 Today's Date: 05/25/2019   History of Present Illness  s/p L TKA  Clinical Impression  Pt is s/p TKA resulting in the deficits listed below (see PT Problem List).  Pt quite motivated and cooperative, gait distance limited by PT d/t pt L knee buckling repeatedly with WBing activity (likely d/t adductor block).   Pt will benefit from skilled PT to increase their independence and safety with mobility to allow discharge to the venue listed below.      Follow Up Recommendations Follow surgeon's recommendation for DC plan and follow-up therapies    Equipment Recommendations  None recommended by PT    Recommendations for Other Services       Precautions / Restrictions Precautions Precautions: Fall;Knee Restrictions Weight Bearing Restrictions: No Other Position/Activity Restrictions: WBAT      Mobility  Bed Mobility Overal bed mobility: Needs Assistance Bed Mobility: Supine to Sit     Supine to sit: Min guard     General bed mobility comments: for trunk to come to sit  Transfers Overall transfer level: Needs assistance Equipment used: Rolling walker (2 wheeled) Transfers: Sit to/from Stand Sit to Stand: Min assist         General transfer comment: cues hand placement  Ambulation/Gait Ambulation/Gait assistance: Min assist;Mod assist Gait Distance (Feet): 15 Feet Assistive device: Rolling walker (2 wheeled) Gait Pattern/deviations: Step-to pattern     General Gait Details: cues for sequence, use of UEs d/t L knee buckling in stance  Stairs            Wheelchair Mobility    Modified Rankin (Stroke Patients Only)       Balance                                             Pertinent Vitals/Pain Pain Assessment: 0-10 Pain Score: 2  Pain Location: L knee Pain Descriptors / Indicators: Aching;Grimacing;Sore Pain Intervention(s):  Limited activity within patient's tolerance;Monitored during session    [place] expects to be discharged to:: Private residence Living Arrangements: Spouse/significant other Available Help at Discharge: Family Type of Home: House Home Access: Elevator;Stairs to enter Entrance Stairs-Rails: Right Entrance Stairs-Number of Steps: 4 Home Layout: Multi-level Home Equipment: Environmental consultant - 2 wheels      Prior Function Level of Independence: Independent               Hand Dominance        Extremity/Trunk Assessment   Upper Extremity Assessment Upper Extremity Assessment: Overall WFL for tasks assessed    Lower Extremity Assessment Lower Extremity Assessment: LLE deficits/detail LLE Deficits / Details: ankle WFL, knee extension and hip flexion grossly 3/5; appeared to have full motor control in bed,  knee buckling with WBing       Communication      Cognition Arousal/Alertness: Awake/alert Behavior During Therapy: WFL for tasks assessed/performed Overall Cognitive Status: Within Functional Limits for tasks assessed                                        General Comments      Exercises Total Joint Exercises Ankle Circles/Pumps: AROM;10 reps;Both Quad Sets: AROM;Both;10 reps   Assessment/Plan    PT Assessment Patient  needs continued PT services  PT Problem List Decreased strength;Decreased range of motion;Decreased activity tolerance;Decreased mobility;Decreased knowledge of use of DME       PT Treatment Interventions DME instruction;Gait training;Functional mobility training;Therapeutic activities;Patient/family education;Therapeutic exercise;Stair training    PT Goals (Current goals can be found in the Care Plan section)  Acute Rehab PT Goals Patient Stated Goal: back to playing golf PT Goal Formulation: With patient Time For Goal Achievement: 06/01/19 Potential to Achieve Goals: Good    Frequency 7X/week   Barriers to  discharge        Co-evaluation               AM-PAC PT "6 Clicks" Mobility  Outcome Measure Help needed turning from your back to your side while in a flat bed without using bedrails?: A Little Help needed moving from lying on your back to sitting on the side of a flat bed without using bedrails?: A Little Help needed moving to and from a bed to a chair (including a wheelchair)?: A Little Help needed standing up from a chair using your arms (e.g., wheelchair or bedside chair)?: A Little Help needed to walk in hospital room?: A Lot Help needed climbing 3-5 steps with a railing? : Total 6 Click Score: 15    End of Session Equipment Utilized During Treatment: Gait belt Activity Tolerance: Patient tolerated treatment well;Other (comment)(limited by L knee buckling) Patient left: in chair;with call bell/phone within reach;with chair alarm set   PT Visit Diagnosis: Difficulty in walking, not elsewhere classified (R26.2)    Time: 7473-4037 PT Time Calculation (min) (ACUTE ONLY): 20 min   Charges:   PT Evaluation $PT Eval Low Complexity: 1 Low          Kenyon Ana, PT  Pager: [phone] Acute Rehab Dept [facility]): [phone]   05/25/2019   [facility] 05/25/2019, 3:49 PM

## 2019-05-25 NOTE — Op Note (Signed)
MRN:     [id number] DOB/AGE:    13-Jan-1936 / 83 y.o.       OPERATIVE REPORT   DATE OF PROCEDURE:  05/25/2019      PREOPERATIVE DIAGNOSIS:   Primary Localized Osteoarthritis left Knee       Estimated body mass index is 30.43 kg/m as calculated from the following:   Height as of this encounter: 5\' 11"  (1.803 m).   Weight as of this encounter: 99 kg.                                                       POSTOPERATIVE DIAGNOSIS:   Same                                                                 PROCEDURE:  Procedure(s): TOTAL KNEE ARTHROPLASTY Using Depuy Attune RP implants #7 Femur, #8Tibia, 66mm  RP bearing, 38 Patella    SURGEON: Tram Wrenn A. Noemi Chapel, MD   ASSISTANT: Matthew Saras, PA-C, present and scrubbed throughout the case, critical for retraction, instrumentation, and closure.  ANESTHESIA: Spinal with Adductor Nerve Block  TOURNIQUET TIME: 58 minutes   COMPLICATIONS:  None       SPECIMENS: None   INDICATIONS FOR PROCEDURE: The patient has djd of the knee with varus deformities, XR shows bone on bone arthritis. Patient has failed all conservative measures including anti-inflammatory medicines, narcotics, attempts at exercise and weight loss, cortisone injections and viscosupplementation.  Risks and benefits of surgery have been discussed, questions answered.    DESCRIPTION OF PROCEDURE: The patient identified by armband, received right adductor canal block and IV antibiotics, in the holding area at [facility]. Patient taken to the operating room, appropriate anesthetic monitors were attached. Spinal anesthesia induced with the patient in supine position, Foley catheter was inserted. Tourniquet applied high to the operative thigh. Lateral post and foot positioner applied to the table, the lower extremity was then prepped and draped in usual sterile fashion from the ankle to the tourniquet. Time-out procedure was performed. The limb was wrapped with an Esmarch bandage  and the tourniquet inflated to 365 mmHg.   We began the operation by making a 6cm anterior midline incision. Small bleeders in the skin and the subcutaneous tissue identified and cauterized. Transverse retinaculum was incised and reflected medially and a medial parapatellar arthrotomy was accomplished. the patella was everted and theprepatellar fat pad resected. The superficial medial collateral ligament was then elevated from anterior to posterior along the proximal flare of the tibia and anterior half of the menisci resected. The knee was hyperflexed exposing bone on bone arthritis. Peripheral and notch osteophytes as well as the cruciate ligaments were then resected. We continued to work our way around posteriorly along the proximal tibia, and externally rotated the tibia subluxing it out from underneath the femur. A McHale retractor was placed through the notch and a lateral Hohmann retractor placed, and an external tibial guide was placed.  The tibial cutting guide was pinned into place allowing resection of 4 mm of bone medially and about 6 mm of bone laterally because of her  varus deformity.   Satisfied with the tibial resection, we then entered the distal femur 2 mm anterior to the PCL origin with the intramedullary guide rod and applied the distal femoral cutting guide set at 73mm, with 5 degrees of valgus. This was pinned along the epicondylar axis. At this point, the distal femoral cut was accomplished without difficulty. We then sized for a 7 femoral component and pinned the guide in 3 degrees of external rotation.The chamfer cutting guide was pinned into place. The anterior, posterior, and chamfer cuts were accomplished without difficulty followed by the  RP box cutting guide and the box cut. We also removed posterior osteophytes from the posterior femoral condyles. At this time, the knee was brought into full extension. We checked our extension and flexion gaps and found them symmetric at 7.  The  patella thickness measured at 19m m. We set the cutting guide at 15 and removed the posterior patella sized for 38 button and drilled the lollipop. The knee was then once again hyperflexed exposing the proximal tibia. We sized for a # 8 tibial base plate, applied the smokestack and the conical reamer followed by the the Delta fin keel punch. We then hammered into place the  RP trial femoral component, inserted a trial bearing, trial patellar button, and took the knee through range of motion from 0-130 degrees. No thumb pressure was required for patellar tracking.   At this point, all trial components were removed, a double batch of DePuy HV cement  was mixed and applied to all bony metallic mating surfaces. In order, we hammered into place the tibial tray and removed excess cement, the femoral component and removed excess cement, a 7 mm  RP bearing was inserted, and the knee brought to full extension with compression. The patellar button was clamped into place, and excess cement removed. While the cement cured the wound was irrigated out with normal saline solution pulse lavage, and exparel was injected throughout the knee. Ligament stability and patellar tracking were checked and found to be excellent..   The parapatellar arthrotomy was closed with  #1 Vicryl suture. The subcutaneous tissue with 0 and 2-0 undyed Vicryl suture, and 4-0 Monocryl.. A dressing of Aquaseal, 4 x 4, dressing sponges, Webril, and Ace wrap applied. Needle and sponge count were correct times 2.The patient awakened, extubated, and taken to recovery room without difficulty. Vascular status was normal, pulses 2+ and symmetric.    Lorn Junes 02/03/2018, 8:56 AM

## 2019-05-25 NOTE — Interval H&P Note (Signed)
History and Physical Interval Note:  05/25/2019 6:44 AM  Gary Sherman  has presented today for surgery, with the diagnosis of djd left knee.  The various methods of treatment have been discussed with the patient and family. After consideration of risks, benefits and other options for treatment, the patient has consented to  Procedure(s): TOTAL KNEE ARTHROPLASTY (Left) as a surgical intervention.  The patient's history has been reviewed, patient examined, no change in status, stable for surgery.  I have reviewed the patient's chart and labs.  Questions were answered to the patient's satisfaction.     Lorn Junes

## 2019-05-25 NOTE — Anesthesia Procedure Notes (Signed)
   Anesthesia Regional Block: Adductor canal block   Pre-Anesthetic Checklist: ,, timeout performed, Correct Patient, Correct Site, Correct Laterality, Correct Procedure, Correct Position, site marked, Risks and benefits discussed,  Surgical consent,  Pre-op evaluation,  At surgeon's request and post-op pain management  Laterality: Left and Lower  Prep: chloraprep       Needles:  Injection technique: Single-shot  Needle Type: Echogenic Needle     Needle Length: 9cm  Needle Gauge: 21     Additional Needles:   Procedures:,,,, ultrasound used (permanent image in chart),,,,  Narrative:  Start time: 05/25/2019 6:58 AM End time: 05/25/2019 7:05 AM Injection made incrementally with aspirations every 5 mL.  Performed by: Personally  Anesthesiologist: Annye Asa, MD  Additional Notes: Pt identified in Holding room.  Monitors applied. Working IV access confirmed. Sterile prep, drape L thigh.  #21ga ECHOgenic needle into adductor canal with US guidance.  20cc 0.75% Ropivacaine injected incrementally after negative test dose.  Patient asymptomatic, VSS, no heme aspirated, tolerated well.  Jenita Seashore, MD

## 2019-05-25 NOTE — Anesthesia Procedure Notes (Signed)
Spinal  Patient location during procedure: OR End time: 05/25/2019 7:24 AM Staffing Anesthesiologist: Annye Asa, MD Performed: anesthesiologist  Preanesthetic Checklist Completed: patient identified, site marked, surgical consent, pre-op evaluation, timeout performed, IV checked, risks and benefits discussed and monitors and equipment checked Spinal Block Patient position: sitting Prep: site prepped and draped and DuraPrep Patient monitoring: blood pressure, continuous pulse ox, heart rate and cardiac monitor Approach: midline Location: L3-4 Injection technique: single-shot Needle Needle type: Pencan  Needle gauge: 24 G Needle length: 9 cm Additional Notes Pt identified in Operating room.  Monitors applied. Working IV access confirmed. Sterile prep, drape lumbar spine.  1% lido local L 3,4.  #24ga Pencan into clear CSF L 3,4.  15mg  0.75% Bupivacaine with dextrose injected with asp CSF beginning and end of injection.  Patient asymptomatic, VSS, no heme aspirated, tolerated well.  Jenita Seashore, MD

## 2019-05-25 NOTE — Transfer of Care (Signed)
Immediate Anesthesia Transfer of Care Note  Patient: Gary Sherman  Procedure(s) Performed: TOTAL KNEE ARTHROPLASTY (Left )  Patient Location: PACU  Anesthesia Type:Spinal  Level of Consciousness: sedated, patient cooperative and responds to stimulation  Airway & Oxygen Therapy: Patient Spontanous Breathing and Patient connected to face mask oxygen  Post-op Assessment: Report given to RN and Post -op Vital signs reviewed and stable  Post vital signs: Reviewed and stable  Last Vitals:  Vitals Value Taken Time  BP 134/72 05/25/19 0924  Temp    Pulse 66 05/25/19 0926  Resp 17 05/25/19 0926  SpO2 100 % 05/25/19 0926  Vitals shown include unvalidated device data.  Last Pain:  Vitals:   05/25/19 0548  TempSrc: Oral  PainSc:       Patients Stated Pain Goal: 4 (20/10/07 1219)  Complications: No apparent anesthesia complications

## 2019-05-25 NOTE — Care Plan (Signed)
Ortho Bundle Case Management Note  Patient Details  Name: Gary Sherman MRN: [id number] Date of Birth: [birth year]  Spoke with patient prior to surgery. He plans to discharge to home with wife and HHPT. Referral to [facility]. equipment ordered.  Patient and MD agreeable with plan. Choice offered.                      DME Arranged:  Gilford Rile rolling, CPM DME Agency:  [employer]  HH Arranged:  PT [place] Agency:  [facility] (formerly [facility]  Additional Comments: Please contact me with any questions of if this plan should need to change.  Ladell Heads,  [facility] Specialist  [phone] 05/25/2019, 9:00 AM

## 2019-05-25 NOTE — Anesthesia Postprocedure Evaluation (Signed)
Anesthesia Post Note  Patient: Gary Sherman  Procedure(s) Performed: TOTAL KNEE ARTHROPLASTY (Left )     Patient location during evaluation: PACU Anesthesia Type: Spinal Level of consciousness: awake and alert, oriented and patient cooperative Pain management: pain level controlled Vital Signs Assessment: post-procedure vital signs reviewed and stable Respiratory status: spontaneous breathing, nonlabored ventilation and respiratory function stable Cardiovascular status: blood pressure returned to baseline and stable Postop Assessment: spinal receding, no apparent nausea or vomiting and patient able to bend at knees Anesthetic complications: no    Last Vitals:  Vitals:   05/25/19 1015 05/25/19 1030  BP: 131/76 (!) 142/78  Pulse: 64 62  Resp: 15 16  Temp:  36.5 C  SpO2: 100% 100%    Last Pain:  Vitals:   05/25/19 1030  TempSrc:   PainSc: 0-No pain                 Shabana Armentrout,E. Charman Blasco

## 2019-05-25 NOTE — Plan of Care (Signed)
  Problem: Education: Goal: Knowledge of the prescribed therapeutic regimen will improve Outcome: Progressing Goal: Individualized Educational Video(s) Outcome: Progressing   Problem: Activity: Goal: Ability to avoid complications of mobility impairment will improve Outcome: Progressing Goal: Range of joint motion will improve Outcome: Progressing   Problem: Clinical Measurements: Goal: Postoperative complications will be avoided or minimized Outcome: Progressing   Problem: Pain Management: Goal: Pain level will decrease with appropriate interventions Outcome: Progressing   Problem: Skin Integrity: Goal: Will show signs of wound healing Outcome: Progressing   Problem: Education: Goal: Knowledge of General Education information will improve Description: Including pain rating scale, medication(s)/side effects and non-pharmacologic comfort measures Outcome: Progressing   Problem: Health Behavior/Discharge Planning: Goal: Ability to manage health-related needs will improve Outcome: Progressing   Problem: Clinical Measurements: Goal: Ability to maintain clinical measurements within normal limits will improve Outcome: Progressing Goal: Will remain free from infection Outcome: Progressing Goal: Diagnostic test results will improve Outcome: Progressing Goal: Respiratory complications will improve Outcome: Progressing Goal: Cardiovascular complication will be avoided Outcome: Progressing   Problem: Activity: Goal: Risk for activity intolerance will decrease Outcome: Progressing   Problem: Nutrition: Goal: Adequate nutrition will be maintained Outcome: Progressing   Problem: Coping: Goal: Level of anxiety will decrease Outcome: Progressing   Problem: Elimination: Goal: Will not experience complications related to bowel motility Outcome: Progressing Goal: Will not experience complications related to urinary retention Outcome: Progressing   Problem: Pain  Managment: Goal: General experience of comfort will improve Outcome: Progressing   Problem: Safety: Goal: Ability to remain free from injury will improve Outcome: Progressing   Problem: Skin Integrity: Goal: Risk for impaired skin integrity will decrease Outcome: Progressing  Progressing as expected for the day of surgery.

## 2019-05-26 ENCOUNTER — Encounter (HOSPITAL_COMMUNITY): Payer: Self-pay | Admitting: Orthopedic Surgery

## 2019-05-26 ENCOUNTER — Inpatient Hospital Stay (HOSPITAL_COMMUNITY): Payer: Medicare Other

## 2019-05-26 LAB — CBC
HCT: 34.7 % — ABNORMAL LOW (ref 39.0–52.0)
Hemoglobin: 11.2 g/dL — ABNORMAL LOW (ref 13.0–17.0)
MCH: 36.4 pg — ABNORMAL HIGH (ref 26.0–34.0)
MCHC: 32.3 g/dL (ref 30.0–36.0)
MCV: 112.7 fL — ABNORMAL HIGH (ref 80.0–100.0)
Platelets: 120 10*3/uL — ABNORMAL LOW (ref 150–400)
RBC: 3.08 MIL/uL — ABNORMAL LOW (ref 4.22–5.81)
RDW: 14.6 % (ref 11.5–15.5)
WBC: 15.5 10*3/uL — ABNORMAL HIGH (ref 4.0–10.5)
nRBC: 0 % (ref 0.0–0.2)

## 2019-05-26 LAB — BASIC METABOLIC PANEL
Anion gap: 10 (ref 5–15)
BUN: 14 mg/dL (ref 8–23)
CO2: 22 mmol/L (ref 22–32)
Calcium: 8.3 mg/dL — ABNORMAL LOW (ref 8.9–10.3)
Chloride: 107 mmol/L (ref 98–111)
Creatinine, Ser: 0.93 mg/dL (ref 0.61–1.24)
GFR calc Af Amer: >60 mL/min (ref >60)
GFR calc non Af Amer: >60 mL/min (ref >60)
Glucose, Bld: 187 mg/dL — ABNORMAL HIGH (ref 70–99)
Potassium: 4.9 mmol/L (ref 3.5–5.1)
Sodium: 139 mmol/L (ref 135–145)

## 2019-05-26 LAB — GLUCOSE, CAPILLARY
Glucose-Capillary: 172 mg/dL — ABNORMAL HIGH (ref 70–99)
Glucose-Capillary: 184 mg/dL — ABNORMAL HIGH (ref 70–99)
Glucose-Capillary: 161 mg/dL — ABNORMAL HIGH (ref 70–99)
Glucose-Capillary: 153 mg/dL — ABNORMAL HIGH (ref 70–99)

## 2019-05-26 NOTE — Progress Notes (Signed)
Subjective: 1 Day Post-Op Procedure(s) (LRB): TOTAL KNEE ARTHROPLASTY (Left) Patient reports pain as under control.  Physical therapist reported that his knee buckled during session yesterday  Objective: Vital signs in last 24 hours: Temp:  [97.4 F (36.3 C)-97.8 F (36.6 C)] 97.8 F (36.6 C) (07/14 0427) Pulse Rate:  [58-76] 58 (07/14 0427) Resp:  [12-16] 14 (07/14 0427) BP: (118-149)/(72-83) 136/81 (07/14 0427) SpO2:  [97 %-100 %] 100 % (07/14 0427)  Intake/Output from previous day: 07/13 0701 - 07/14 0700 In: 4187.6 [P.O.:620; I.V.:3467.6; IV Piggyback:100] Out: 9201 [Urine:3025; Blood:50] Intake/Output this shift: No intake/output data recorded.  Recent Labs    05/26/19 0317  HGB 11.2*   Recent Labs    05/26/19 0317  WBC 15.5*  RBC 3.08*  HCT 34.7*  PLT 120*   Recent Labs    05/26/19 0317  NA 139  K 4.9  CL 107  CO2 22  BUN 14  CREATININE 0.93  GLUCOSE 187*  CALCIUM 8.3*   No results for input(s): LABPT, INR in the last 72 hours.  Patient's left knee has moderate swelling that is expected but he has no active knee extension.  He has hip flexion with his quad.     Assessment/Plan: 1 Day Post-Op Procedure(s) (LRB): TOTAL KNEE ARTHROPLASTY (Left) Advance diet Up with therapy  Patient must wear knee immobilizer whenever he is up.  He does not have active quad control of his knee at this time.  I have ordered a knee xray to look for patella alta.  We will watch closely as his adductor canal block wears of to see if he regains active knee extension.   I will check him this afternoon.  Want therapy to walk him in knee immobilizer but work on active quad control as well.  Anticipate watching patient closely today and discharge tomorrow.   Anticipated LOS equal to or greater than 2 midnights due to - Age 81 and older with one or more of the following:  - Obesity  - Expected need for hospital services (PT, OT, Nursing) required for safe  discharge  -  Anticipated need for postoperative skilled nursing care or inpatient rehab  - Active co-morbidities: Diabetes and Cardiac Arrhythmia OR   - Unanticipated findings during/Post Surgery: patient currently lacks active extension of his left knee  - Patient is a high risk of re-admission due to: None    Jawon Dipiero J Arnulfo Batson 05/26/2019, 8:36 AM

## 2019-05-26 NOTE — Progress Notes (Signed)
Physical Therapy Treatment Patient Details Name: Gary Sherman MRN: [id number] DOB: [birth year] Today's Date: 05/26/2019    History of Present Illness s/p L TKA    PT Comments    Pt is progressing but with LOB x1 with amb x ~ 65' requiring min-mod assist to recover. Pt will likely need one more day to allow safe d/c home. RN aware  Follow Up Recommendations  Follow surgeon's recommendation for DC plan and follow-up therapies     Equipment Recommendations  None recommended by PT    Recommendations for Other Services       Precautions / Restrictions Precautions Precautions: Fall;Knee Required Braces or Orthoses: Knee Immobilizer - Left Restrictions Weight Bearing Restrictions: No Other Position/Activity Restrictions: WBAT    Mobility  Bed Mobility Overal bed mobility: Needs Assistance Bed Mobility: Supine to Sit     Supine to sit: Min assist     General bed mobility comments: assist with LLE   Transfers Overall transfer level: Needs assistance Equipment used: Rolling walker (2 wheeled) Transfers: Sit to/from Stand Sit to Stand: Min assist;From elevated surface         General transfer comment: cues hand placement, assist to rise and transition to RW safely  Ambulation/Gait Ambulation/Gait assistance: Min assist;Mod assist Gait Distance (Feet): 85 Feet Assistive device: Rolling walker (2 wheeled) Gait Pattern/deviations: Step-to pattern     General Gait Details: cues for sequence, posture, LOB x1 requiring mod assist to recover   Stairs             Wheelchair Mobility    Modified Rankin (Stroke Patients Only)       Balance Overall balance assessment: Needs assistance         Standing balance support: Bilateral upper extremity supported Standing balance-Leahy Scale: Poor Standing balance comment: reliant on UEs                            Cognition Arousal/Alertness: Awake/alert Behavior During Therapy: WFL for tasks  assessed/performed Overall Cognitive Status: Within Functional Limits for tasks assessed                                        Exercises Total Joint Exercises Ankle Circles/Pumps: AROM;10 reps;Both    General Comments        Pertinent Vitals/Pain Pain Assessment: 0-10 Pain Score: 5  Pain Location: L knee Pain Descriptors / Indicators: Aching;Grimacing;Sore Pain Intervention(s): Limited activity within patient's tolerance;Monitored during session;Premedicated before session;Repositioned    Home Living                      Prior Function            PT Goals (current goals can now be found in the care plan section) Acute Rehab PT Goals Patient Stated Goal: back to playing golf PT Goal Formulation: With patient Time For Goal Achievement: 06/01/19 Potential to Achieve Goals: Good Progress towards PT goals: Progressing toward goals    Frequency    7X/week      PT Plan Current plan remains appropriate    Co-evaluation              AM-PAC PT "6 Clicks" Mobility   Outcome Measure  Help needed turning from your back to your side while in a flat bed without using bedrails?: A Little Help needed moving  from lying on your back to sitting on the side of a flat bed without using bedrails?: A Little Help needed moving to and from a bed to a chair (including a wheelchair)?: A Little Help needed standing up from a chair using your arms (e.g., wheelchair or bedside chair)?: A Little Help needed to walk in hospital room?: A Little Help needed climbing 3-5 steps with a railing? : A Lot 6 Click Score: 17    End of Session Equipment Utilized During Treatment: Gait belt;Left knee immobilizer Activity Tolerance: Patient tolerated treatment well Patient left: in chair;with call bell/phone within reach;with chair alarm set Nurse Communication: (not ready for d/c today) PT Visit Diagnosis: Difficulty in walking, not elsewhere classified (R26.2)      Time: 1140-1159 PT Time Calculation (min) (ACUTE ONLY): 19 min  Charges:  $Gait Training: 8-22 mins                     Kenyon Ana, PT  Pager: [phone] Acute Rehab Dept [facility]): [phone]   05/26/2019    [facility] 05/26/2019, 12:08 PM

## 2019-05-26 NOTE — Progress Notes (Signed)
   05/26/19 1500  PT Visit Information  Last PT Received On 05/26/19 progressing but not ready for d/c yet. Will benefit from another day of PT to maxmize independence and safety  Assistance Needed +1  History of Present Illness s/p L TKA  Subjective Data  Patient Stated Goal back to playing golf  Precautions  Precautions Fall;Knee  Required Braces or Orthoses Knee Immobilizer - Left  Restrictions  Other Position/Activity Restrictions WBAT  Pain Assessment  Pain Assessment 0-10  Pain Score 4  Pain Location L knee  Pain Descriptors / Indicators Aching;Grimacing;Sore  Pain Intervention(s) Limited activity within patient's tolerance;Monitored during session;Repositioned;Premedicated before session  Cognition  Arousal/Alertness Awake/alert  Behavior During Therapy WFL for tasks assessed/performed  Overall Cognitive Status Within Functional Limits for tasks assessed  Bed Mobility  Overal bed mobility Needs Assistance  Bed Mobility Sit to Supine  Sit to supine Min guard;Min assist  General bed mobility comments assist with LLE   Transfers  Overall transfer level Needs assistance  Equipment used Rolling walker (2 wheeled)  Transfers Sit to/from Stand  Sit to Stand Min guard;Min assist  General transfer comment cues hand placement, assist to rise and transition to RW safely  Ambulation/Gait  Ambulation/Gait assistance Min assist  Gait Distance (Feet) 8 Feet  Assistive device Rolling walker (2 wheeled)  Gait Pattern/deviations Step-to pattern  General Gait Details cues for sequence and safe use of RW  Total Joint Exercises  Ankle Circles/Pumps AROM;10 reps;Both  Quad Sets AROM;Both;10 reps  Hip ABduction/ADduction AROM;AAROM;Left;10 reps  Straight Leg Raises AAROM;AROM;10 reps;Left  Heel Slides AAROM;Left;10 reps  Goniometric ROM grossly g to 75 degrees aarom  Short Arc Quad AROM;Left;10 reps  PT - End of Session  Equipment Utilized During Treatment Gait belt;Left knee  immobilizer  Activity Tolerance Patient tolerated treatment well;Patient limited by fatigue  Patient left in bed;with call bell/phone within reach;with bed alarm set   PT - Assessment/Plan  PT Plan Current plan remains appropriate  PT Visit Diagnosis Difficulty in walking, not elsewhere classified (R26.2)  PT Frequency (ACUTE ONLY) 7X/week  Follow Up Recommendations Follow surgeon's recommendation for DC plan and follow-up therapies  PT equipment None recommended by PT  AM-PAC PT "6 Clicks" Mobility Outcome Measure (Version 2)  Help needed turning from your back to your side while in a flat bed without using bedrails? 3  Help needed moving from lying on your back to sitting on the side of a flat bed without using bedrails? 3  Help needed moving to and from a bed to a chair (including a wheelchair)? 3  Help needed standing up from a chair using your arms (e.g., wheelchair or bedside chair)? 3  Help needed to walk in hospital room? 3  Help needed climbing 3-5 steps with a railing?  2  6 Click Score 17  Consider Recommendation of Discharge To: Home with [facility]  PT Goal Progression  Progress towards PT goals Progressing toward goals  Acute Rehab PT Goals  PT Goal Formulation With patient  Time For Goal Achievement 06/01/19  Potential to Achieve Goals Good  PT Time Calculation  PT Start Time (ACUTE ONLY) 1435  PT Stop Time (ACUTE ONLY) 1455  PT Time Calculation (min) (ACUTE ONLY) 20 min  PT General Charges  $$ ACUTE PT VISIT 1 Visit  PT Treatments  $Therapeutic Exercise 8-22 mins

## 2019-05-27 LAB — BASIC METABOLIC PANEL
Anion gap: 11 (ref 5–15)
BUN: 19 mg/dL (ref 8–23)
CO2: 22 mmol/L (ref 22–32)
Calcium: 8.6 mg/dL — ABNORMAL LOW (ref 8.9–10.3)
Chloride: 106 mmol/L (ref 98–111)
Creatinine, Ser: 0.84 mg/dL (ref 0.61–1.24)
GFR calc Af Amer: >60 mL/min (ref >60)
GFR calc non Af Amer: >60 mL/min (ref >60)
Glucose, Bld: 172 mg/dL — ABNORMAL HIGH (ref 70–99)
Potassium: 4.6 mmol/L (ref 3.5–5.1)
Sodium: 139 mmol/L (ref 135–145)

## 2019-05-27 LAB — CBC
HCT: 34.4 % — ABNORMAL LOW (ref 39.0–52.0)
Hemoglobin: 11.3 g/dL — ABNORMAL LOW (ref 13.0–17.0)
MCH: 36.6 pg — ABNORMAL HIGH (ref 26.0–34.0)
MCHC: 32.8 g/dL (ref 30.0–36.0)
MCV: 111.3 fL — ABNORMAL HIGH (ref 80.0–100.0)
Platelets: 166 10*3/uL (ref 150–400)
RBC: 3.09 MIL/uL — ABNORMAL LOW (ref 4.22–5.81)
RDW: 14.8 % (ref 11.5–15.5)
WBC: 21.8 10*3/uL — ABNORMAL HIGH (ref 4.0–10.5)
nRBC: 0.1 % (ref 0.0–0.2)

## 2019-05-27 LAB — GLUCOSE, CAPILLARY
Glucose-Capillary: 140 mg/dL — ABNORMAL HIGH (ref 70–99)
Glucose-Capillary: 146 mg/dL — ABNORMAL HIGH (ref 70–99)

## 2019-05-27 MED ORDER — OXYCODONE HCL 5 MG PO TABS: ORAL_TABLET | ORAL | 0 refills | Status: AC

## 2019-05-27 MED ORDER — ASPIRIN 81 MG PO TBEC: DELAYED_RELEASE_TABLET | ORAL | 12 refills | Status: AC

## 2019-05-27 MED ORDER — POLYETHYLENE GLYCOL 3350 17 G PO PACK: PACK | ORAL | 0 refills | Status: AC

## 2019-05-27 MED ORDER — DOCUSATE SODIUM 100 MG PO CAPS: ORAL_CAPSULE | ORAL | 0 refills | Status: AC

## 2019-05-27 NOTE — Discharge Summary (Signed)
Patient ID: Gary Sherman MRN: [id number] DOB/AGE: May 11, 1936 83 y.o.  Admit date: 05/25/2019 Discharge date: 05/27/2019  Admission Diagnoses:  Active Problems:   Primary localized osteoarthritis of left knee   Essential hypertension   Malignant neoplasm of prostate ([place])   Myasthenia gravis without exacerbation (HCC)   Diabetes mellitus type 2 in nonobese [facility])   Discharge Diagnoses:  Same  Past Medical History:  Diagnosis Date  . Cancer [facility])    prostate 2007 radation Rx  . Diabetes mellitus type 2 in nonobese ([place]) 05/11/2019  . Dysrhythmia 05/2019   PVC  . Fracture of radial head, left, closed 10/29/2013  . GERD (gastroesophageal reflux disease)   . Hypertension   . Myasthenia gravis ([place])   . Primary localized osteoarthritis of left knee 03/12/2019  . Seasonal allergies     Surgeries: Procedure(s): TOTAL KNEE ARTHROPLASTY on 05/25/2019   Consultants:   Discharged Condition: Improved  Hospital Course: Gary Sherman is an 83 y.o. male who was admitted 05/25/2019 for operative treatment of<principal problem not specified>. Patient has severe unremitting pain that affects sleep, daily activities, and work/hobbies. After pre-op clearance the patient was taken to the operating room on 05/25/2019 and underwent  Procedure(s): TOTAL KNEE ARTHROPLASTY.    Patient was given perioperative antibiotics:  Anti-infectives (From admission, onward)   Start     Dose/Rate Route Frequency Ordered Stop   05/25/19 1400  ceFAZolin (ANCEF) IVPB 2g/100 mL premix     2 g 200 mL/hr over 30 Minutes Intravenous Every 6 hours 05/25/19 1114 05/25/19 2207   05/25/19 0718  ceFAZolin (ANCEF) 2-4 GM/100ML-% IVPB    Note to Pharmacy: Bridget Hartshorn   : cabinet override      05/25/19 0718 05/25/19 1929       Patient was given sequential compression devices, early ambulation, and chemoprophylaxis to prevent DVT.  Patient benefited maximally from hospital stay and there were no  complications.    Recent vital signs:  Patient Vitals for the past 24 hrs:  BP Temp Temp src Pulse Resp SpO2  05/27/19 0553 134/75 98.1 F (36.7 C) Oral 67 18 98 %  05/26/19 2104 (!) 149/81 98 F (36.7 C) Oral 75 18 99 %  05/26/19 1334 114/61 97.8 F (36.6 C) - 63 16 100 %  05/26/19 1013 127/83 97.8 F (36.6 C) Oral 64 16 100 %     Recent laboratory studies:  Recent Labs    05/26/19 0317 05/27/19 0313  WBC 15.5* 21.8*  HGB 11.2* 11.3*  HCT 34.7* 34.4*  PLT 120* 166  NA 139 139  K 4.9 4.6  CL 107 106  CO2 22 22  BUN 14 19  CREATININE 0.93 0.84  GLUCOSE 187* 172*  CALCIUM 8.3* 8.6*     Discharge Medications:   Allergies as of 05/27/2019      Reactions   Nitroglycerin Anaphylaxis      Medication List    TAKE these medications   [place] 0.025 % ophthalmic solution Generic drug: ketotifen Place 2 drops into both eyes 2 (two) times daily as needed (for dry eyes).   aspirin 81 MG EC tablet 81 mg tablet twice a day for 30 days to prevent blood clots What changed:   how much to take  how to take this  when to take this  additional instructions   azaTHIOprine 50 MG tablet Commonly known as: IMURAN Take 150 mg by mouth daily.   docusate sodium 100 MG capsule Commonly known as: COLACE 1  tab 2 times a day while on narcotics.  STOOL SOFTENER   Dymista 137-50 MCG/ACT Susp Generic drug: Azelastine-Fluticasone Place 1 spray into the nose 2 (two) times daily as needed (for congestion).   gabapentin 300 MG capsule Commonly known as: NEURONTIN Take 600 mg by mouth 3 (three) times daily.   insulin glargine 100 UNIT/ML injection Commonly known as: LANTUS Inject 35 Units into the skin daily.   olmesartan 20 MG tablet Commonly known as: BENICAR Take 20 mg by mouth daily.   omeprazole 20 MG capsule Commonly known as: PRILOSEC Take 20 mg by mouth daily as needed (for heartburn or acid reflux).   oxyCODONE 5 MG immediate release tablet Commonly known as:  Oxy IR/ROXICODONE 1 po q 4 hrs prn pain   polyethylene glycol 17 g packet Commonly known as: MIRALAX / GLYCOLAX 17grams in 6 oz of something to drink twice a day until bowel movement.  LAXITIVE.  Restart if two days since last bowel movement   predniSONE 2.5 MG tablet Commonly known as: DELTASONE Take 2.5 mg by mouth every Monday, Wednesday, and Friday.            Discharge Care Instructions  (From admission, onward)         Start     Ordered   05/27/19 0000  Change dressing    Comments: DO NOT REMOVE BANDAGE OVER SURGICAL INCISION.  [place] WHOLE LEG INCLUDING OVER THE WATERPROOF BANDAGE WITH SOAP AND WATER EVERY DAY.   05/27/19 0723          Diagnostic Studies: Dg Knee Left Port  Result Date: 05/26/2019 CLINICAL DATA:  Knee replacement yesterday. Unable to actively extend knee. EXAM: PORTABLE LEFT KNEE - 1-2 VIEW COMPARISON:  None. FINDINGS: The left knee demonstrates a total knee arthroplasty without evidence of hardware failure or complication. There is expected intra-articular air and a large joint effusion. There is no fracture or dislocation. The alignment is anatomic. No patella alta. Post-surgical changes noted in the surrounding soft tissues. IMPRESSION: 1. Status post left total knee arthroplasty without acute postoperative complication. No patella alta. Electronically Signed   By: Titus Dubin M.D.   On: 05/26/2019 10:27    Disposition: Discharge disposition: 01-Home or Self Care       Discharge Instructions    CPM   Complete by: As directed    Continuous passive motion machine (CPM):      Use the CPM from 0 to 90 for 6 hours per day.       You may break it up into 2 or 3 sessions per day.      Use CPM for 2 weeks or until you are told to stop.   Call MD / Call 911   Complete by: As directed    If you experience chest pain or shortness of breath, CALL 911 and be transported to the hospital emergency room.  If you develope a fever above 101 F, pus (white  drainage) or increased drainage or redness at the wound, or calf pain, call your surgeon's office.   Change dressing   Complete by: As directed    DO NOT REMOVE BANDAGE OVER SURGICAL INCISION.  [place] WHOLE LEG INCLUDING OVER THE WATERPROOF BANDAGE WITH SOAP AND WATER EVERY DAY.   Constipation Prevention   Complete by: As directed    Drink plenty of fluids.  Prune juice may be helpful.  You may use a stool softener, such as Colace (over the counter) 100 mg twice a  day.  Use MiraLax (over the counter) for constipation as needed.   Diet - low sodium heart healthy   Complete by: As directed    Discharge instructions   Complete by: As directed    INSTRUCTIONS AFTER JOINT REPLACEMENT   DO NOT RESTART OLMESARTAN UNTIL BLOOD PRESSURE IS HIGHER THAN 140/90  Remove items at home which could result in a fall. This includes throw rugs or furniture in walking pathways ICE to the affected joint every three hours while awake for 30 minutes at a time, for at least the first 3-5 days, and then as needed for pain and swelling.  Continue to use ice for pain and swelling. You may notice swelling that will progress down to the foot and ankle.  This is normal after surgery.  Elevate your leg when you are not up walking on it.   Continue to use the breathing machine you got in the hospital (incentive spirometer) which will help keep your temperature down.  It is common for your temperature to cycle up and down following surgery, especially at night when you are not up moving around and exerting yourself.  The breathing machine keeps your lungs expanded and your temperature down.   DIET:  As you were doing prior to hospitalization, we recommend a well-balanced diet.  DRESSING / WOUND CARE / SHOWERING  Keep the surgical dressing until follow up.  The dressing is water proof, so you can shower without any extra covering.  IF THE DRESSING FALLS OFF or the wound gets wet inside, change the dressing with sterile gauze.   Please use good hand washing techniques before changing the dressing.  Do not use any lotions or creams on the incision until instructed by your surgeon.    ACTIVITY  Increase activity slowly as tolerated, but follow the weight bearing instructions below.   No driving for 6 weeks or until further direction given by your physician.  You cannot drive while taking narcotics.  No lifting or carrying greater than 10 lbs. until further directed by your surgeon. Avoid periods of inactivity such as sitting longer than an hour when not asleep. This helps prevent blood clots.  You may return to work once you are authorized by your doctor.     WEIGHT BEARING   Weight bearing as tolerated with assist device (walker, cane, etc) as directed, use it as long as suggested by your surgeon or therapist, typically at least 2-3 weeks.   EXERCISES  Results after joint replacement surgery are often greatly improved when you follow the exercise, range of motion and muscle strengthening exercises prescribed by your doctor. Safety measures are also important to protect the joint from further injury. Any time any of these exercises cause you to have increased pain or swelling, decrease what you are doing until you are comfortable again and then slowly increase them. If you have problems or questions, call your caregiver or physical therapist for advice.   Rehabilitation is important following a joint replacement. After just a few days of immobilization, the muscles of the leg can become weakened and shrink (atrophy).  These exercises are designed to build up the tone and strength of the thigh and leg muscles and to improve motion. Often times heat used for twenty to thirty minutes before working out will loosen up your tissues and help with improving the range of motion but do not use heat for the first two weeks following surgery (sometimes heat can increase post-operative swelling).   These  exercises can be done on a  training (exercise) mat, on the floor, on a table or on a bed. Use whatever works the best and is most comfortable for you.    Use music or television while you are exercising so that the exercises are a pleasant break in your day. This will make your life better with the exercises acting as a break in your routine that you can look forward to.   Perform all exercises about fifteen times, three times per day or as directed.  You should exercise both the operative leg and the other leg as well.   Exercises include:  Quad Sets - Tighten up the muscle on the front of the thigh (Quad) and hold for 5-10 seconds.   Straight Leg Raises - With your knee straight (if you were given a brace, keep it on), lift the leg to 60 degrees, hold for 3 seconds, and slowly lower the leg.  Perform this exercise against resistance later as your leg gets stronger.  Leg Slides: Lying on your back, slowly slide your foot toward your buttocks, bending your knee up off the floor (only go as far as is comfortable). Then slowly slide your foot back down until your leg is flat on the floor again.  Angel Wings: Lying on your back spread your legs to the side as far apart as you can without causing discomfort.  Hamstring Strength:  Lying on your back, push your heel against the floor with your leg straight by tightening up the muscles of your buttocks.  Repeat, but this time bend your knee to a comfortable angle, and push your heel against the floor.  You may put a pillow under the heel to make it more comfortable if necessary.   A rehabilitation program following joint replacement surgery can speed recovery and prevent re-injury in the future due to weakened muscles. Contact your doctor or a physical therapist for more information on knee rehabilitation.    CONSTIPATION  Constipation is defined medically as fewer than three stools per week and severe constipation as less than one stool per week.  Even if you have a regular bowel  pattern at home, your normal regimen is likely to be disrupted due to multiple reasons following surgery.  Combination of anesthesia, postoperative narcotics, change in appetite and fluid intake all can affect your bowels.   YOU MUST use at least one of the following options; they are listed in order of increasing strength to get the job done.  They are all available over the counter, and you may need to use some, POSSIBLY even all of these options:    Drink plenty of fluids (prune juice may be helpful) and high fiber foods Colace 100 mg by mouth twice a day  Senokot for constipation as directed and as needed Dulcolax (bisacodyl), take with full glass of water  Miralax (polyethylene glycol) once or twice a day as needed.  If you have tried all these things and are unable to have a bowel movement in the first 3-4 days after surgery call either your surgeon or your primary doctor.    If you experience loose stools or diarrhea, hold the medications until you stool forms back up.  If your symptoms do not get better within 1 week or if they get worse, check with your doctor.  If you experience "the worst abdominal pain ever" or develop nausea or vomiting, please contact the office immediately for further recommendations for treatment.   ITCHING:  If you experience itching with your medications, try taking only a single pain pill, or even half a pain pill at a time.  You can also use Benadryl over the counter for itching or also to help with sleep.   TED HOSE STOCKINGS:  Use stockings on both legs until for at least 2 weeks or as directed by physician office. They may be removed at night for sleeping.  MEDICATIONS:  See your medication summary on the "After Visit Summary" that nursing will review with you.  You may have some home medications which will be placed on hold until you complete the course of blood thinner medication.  It is important for you to complete the blood thinner medication as  prescribed.  PRECAUTIONS:  If you experience chest pain or shortness of breath - call 911 immediately for transfer to the hospital emergency department.   If you develop a fever greater that 101 F, purulent drainage from wound, increased redness or drainage from wound, foul odor from the wound/dressing, or calf pain - CONTACT YOUR SURGEON.                                                   FOLLOW-UP APPOINTMENTS:  If you do not already have a post-op appointment, please call the office for an appointment to be seen by your surgeon.  Guidelines for how soon to be seen are listed in your "After Visit Summary", but are typically between 1-4 weeks after surgery.  OTHER INSTRUCTIONS:   Knee Replacement:  Do not place pillow under knee, focus on keeping the knee straight while resting. CPM instructions: 0-90 degrees, 2 hours in the morning, 2 hours in the afternoon, and 2 hours in the evening. Place foam block, curve side up under heel at all times except when in CPM or when walking.  DO NOT modify, tear, cut, or change the foam block in any way.  MAKE SURE YOU:  Understand these instructions.  Get help right away if you are not doing well or get worse.    Thank you for letting us be a part of your medical care team.  It is a privilege we respect greatly.  We hope these instructions will help you stay on track for a fast and full recovery!   Do not put a pillow under the knee. Place it under the heel.   Complete by: As directed    Place gray foam block, curve side up under heel at all times except when in CPM or when walking.  DO NOT modify, tear, cut, or change in any way the gray foam block.   Increase activity slowly as tolerated   Complete by: As directed    Patient may shower   Complete by: As directed    Aquacel dressing is water proof    Wash over it and the whole leg with soap and water at the end of your shower   TED hose   Complete by: As directed    Use stockings (TED hose) for 2  weeks on both leg(s).  You may remove them at night for sleeping.      Follow-up Information    Elsie Saas, MD. Go on 06/08/2019.   Specialty: Orthopedic Surgery Why: Your appointment has been scheduled for 3:15.  Contact information: [place]  [place] [address] [phone]        [facility] Follow up.   Specialty: [place] Why: You will have 5 HHPT visits prior to starting outpatient physical therapy  Contact information: [address] [place] [address] [place], [place] on 06/09/2019.   Why: You are scheduled to start Outpatient Physical therapy on 7/28. The therapy office should have called you with a start time. If you have not heard from them, please contact Dr. Archie Endo office. - [place] [phone] Contact information: Physical Therapy [address] [place] [address] [phone]            Signed: Linda Hedges 05/27/2019, 7:25 AM

## 2019-05-27 NOTE — Progress Notes (Signed)
Physical Therapy Treatment Patient Details Name: Gary Sherman MRN: [id number] DOB: June 02, 1936 Today's Date: 05/27/2019    History of Present Illness s/p L TKA    PT Comments    Pt progressing well. Ready for d/c today  Follow Up Recommendations  Follow surgeon's recommendation for DC plan and follow-up therapies     Equipment Recommendations  None recommended by PT    Recommendations for Other Services       Precautions / Restrictions Precautions Precautions: Fall;Knee Precaution Comments: IND SLR today Required Braces or Orthoses: Knee Immobilizer - Left Restrictions Weight Bearing Restrictions: No Other Position/Activity Restrictions: WBAT    Mobility  Bed Mobility Overal bed mobility: Needs Assistance Bed Mobility: Supine to Sit     Supine to sit: Supervision     General bed mobility comments: for safety  Transfers Overall transfer level: Needs assistance Equipment used: Rolling walker (2 wheeled) Transfers: Sit to/from Stand Sit to Stand: Min guard;Supervision         General transfer comment: cues hand placement, supervision for safety  Ambulation/Gait Ambulation/Gait assistance: Min assist   Assistive device: Rolling walker (2 wheeled) Gait Pattern/deviations: Step-to pattern     General Gait Details: cues for sequence and safe use of RW   Stairs Stairs: Yes Stairs assistance: Min guard Stair Management: One rail Right;One rail Left;Step to pattern;Sideways Number of Stairs: 5(x2) General stair comments: cues for sequence and technique   Wheelchair Mobility    Modified Rankin (Stroke Patients Only)       Balance                                            Cognition Arousal/Alertness: Awake/alert Behavior During Therapy: WFL for tasks assessed/performed Overall Cognitive Status: Within Functional Limits for tasks assessed                                        Exercises Total Joint  Exercises Ankle Circles/Pumps: AROM;10 reps;Both Quad Sets: AROM;Both;10 reps Short Arc Quad: AROM;Left;10 reps Heel Slides: AAROM;Left;10 reps Hip ABduction/ADduction: AROM;AAROM;Left;10 reps Straight Leg Raises: AAROM;AROM;10 reps;Left    General Comments        Pertinent Vitals/Pain Pain Assessment: 0-10 Pain Score: 3  Pain Location: L knee Pain Descriptors / Indicators: Sore;Guarding;Tightness Pain Intervention(s): Monitored during session    Home Living                      Prior Function            PT Goals (current goals can now be found in the care plan section) Acute Rehab PT Goals Patient Stated Goal: back to playing golf PT Goal Formulation: With patient Time For Goal Achievement: 06/01/19 Potential to Achieve Goals: Good Progress towards PT goals: Progressing toward goals    Frequency    7X/week      PT Plan Current plan remains appropriate    Co-evaluation              AM-PAC PT "6 Clicks" Mobility   Outcome Measure  Help needed turning from your back to your side while in a flat bed without using bedrails?: A Little Help needed moving from lying on your back to sitting on the side of a flat bed without using bedrails?:  A Little Help needed moving to and from a bed to a chair (including a wheelchair)?: A Little Help needed standing up from a chair using your arms (e.g., wheelchair or bedside chair)?: A Little Help needed to walk in hospital room?: A Little Help needed climbing 3-5 steps with a railing? : A Lot 6 Click Score: 17    End of Session Equipment Utilized During Treatment: Gait belt Activity Tolerance: Patient tolerated treatment well;Patient limited by fatigue Patient left: with call bell/phone within reach;in chair;with chair alarm set   PT Visit Diagnosis: Difficulty in walking, not elsewhere classified (R26.2)     Time: 1188-6773 PT Time Calculation (min) (ACUTE ONLY): 41 min  Charges:  $Gait Training: 23-37  mins $Therapeutic Exercise: 8-22 mins                     Kenyon Ana, PT  Pager: [phone] Acute Rehab Dept [facility]): [phone]   05/27/2019    [facility] 05/27/2019, 10:38 AM

## 2019-11-13 DEATH — deceased

## 2021-02-27 IMAGING — DX PORTABLE LEFT KNEE - 1-2 VIEW
2 series · 2 of 2 positions shown · non-contrast
Comparison: None.

CLINICAL DATA: Knee replacement yesterday. Unable to actively
extend knee.

EXAM:
PORTABLE LEFT KNEE - 1-2 VIEW

[knee lat (1 of 2)]
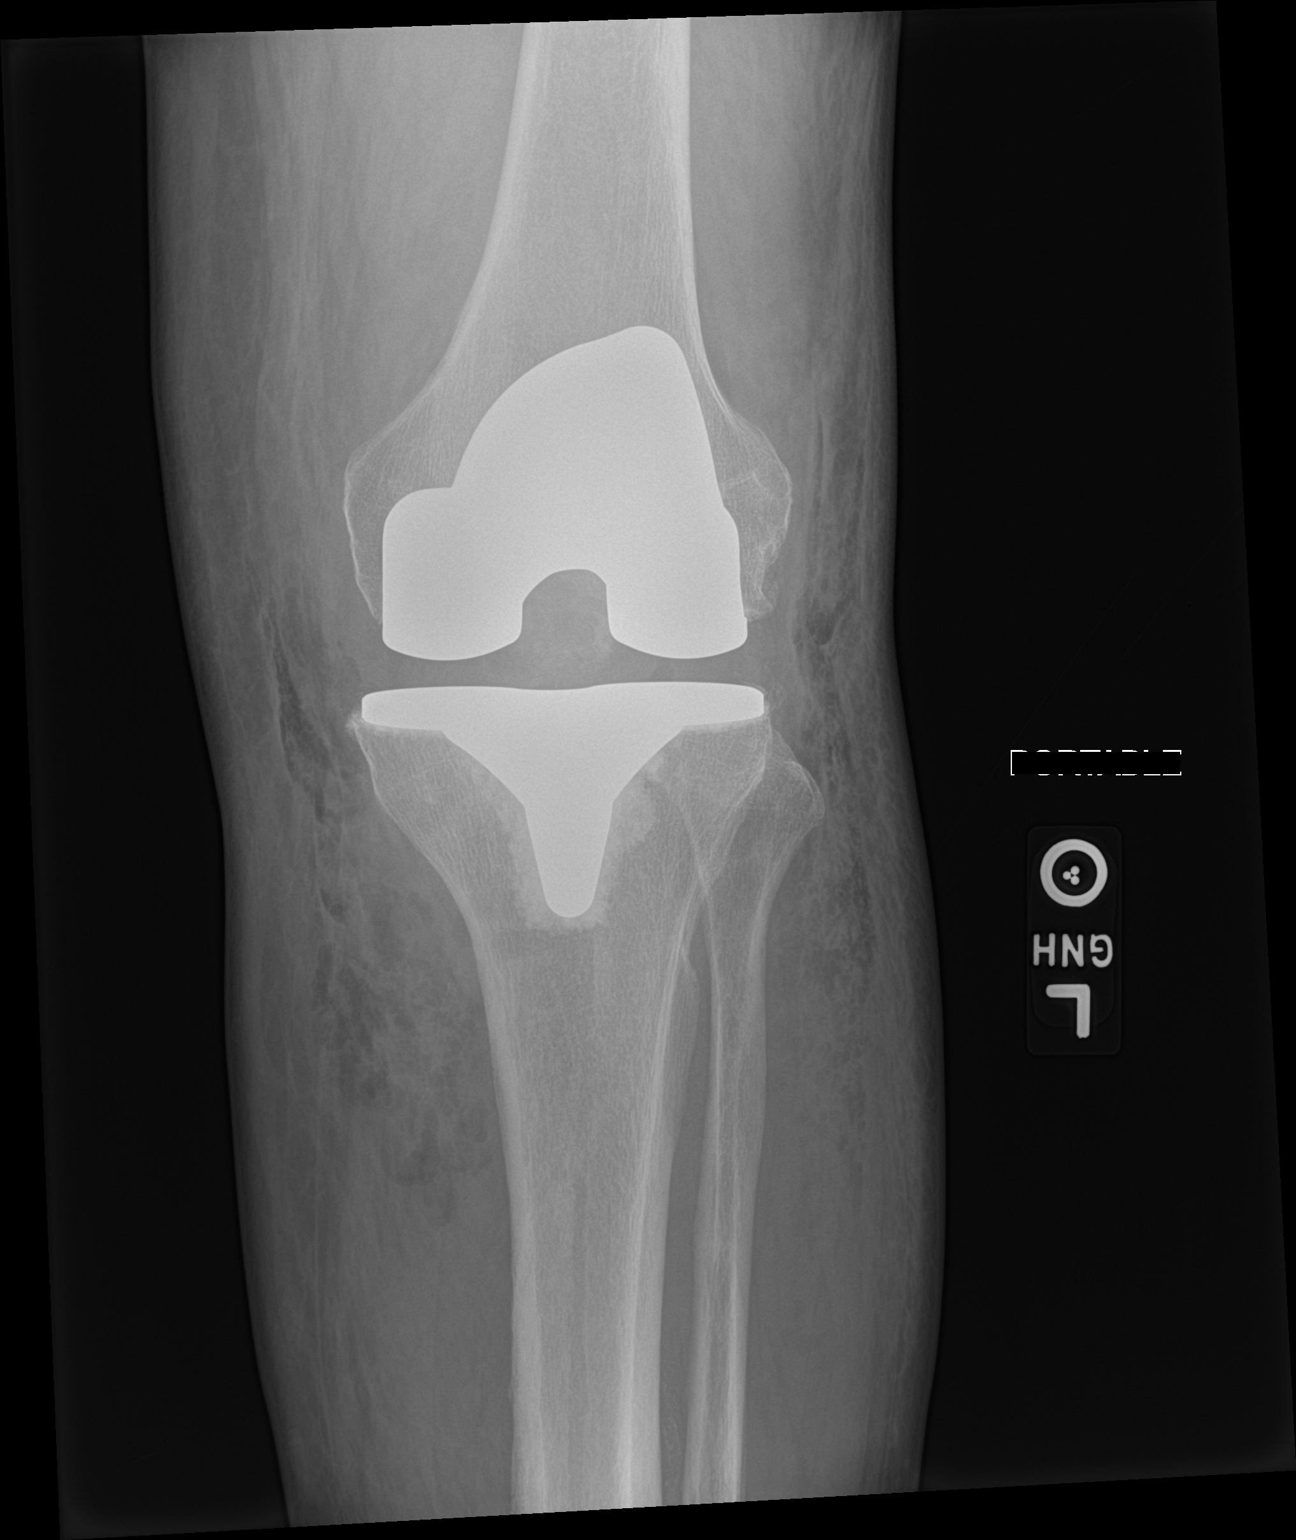

[knee lat (2 of 2)]
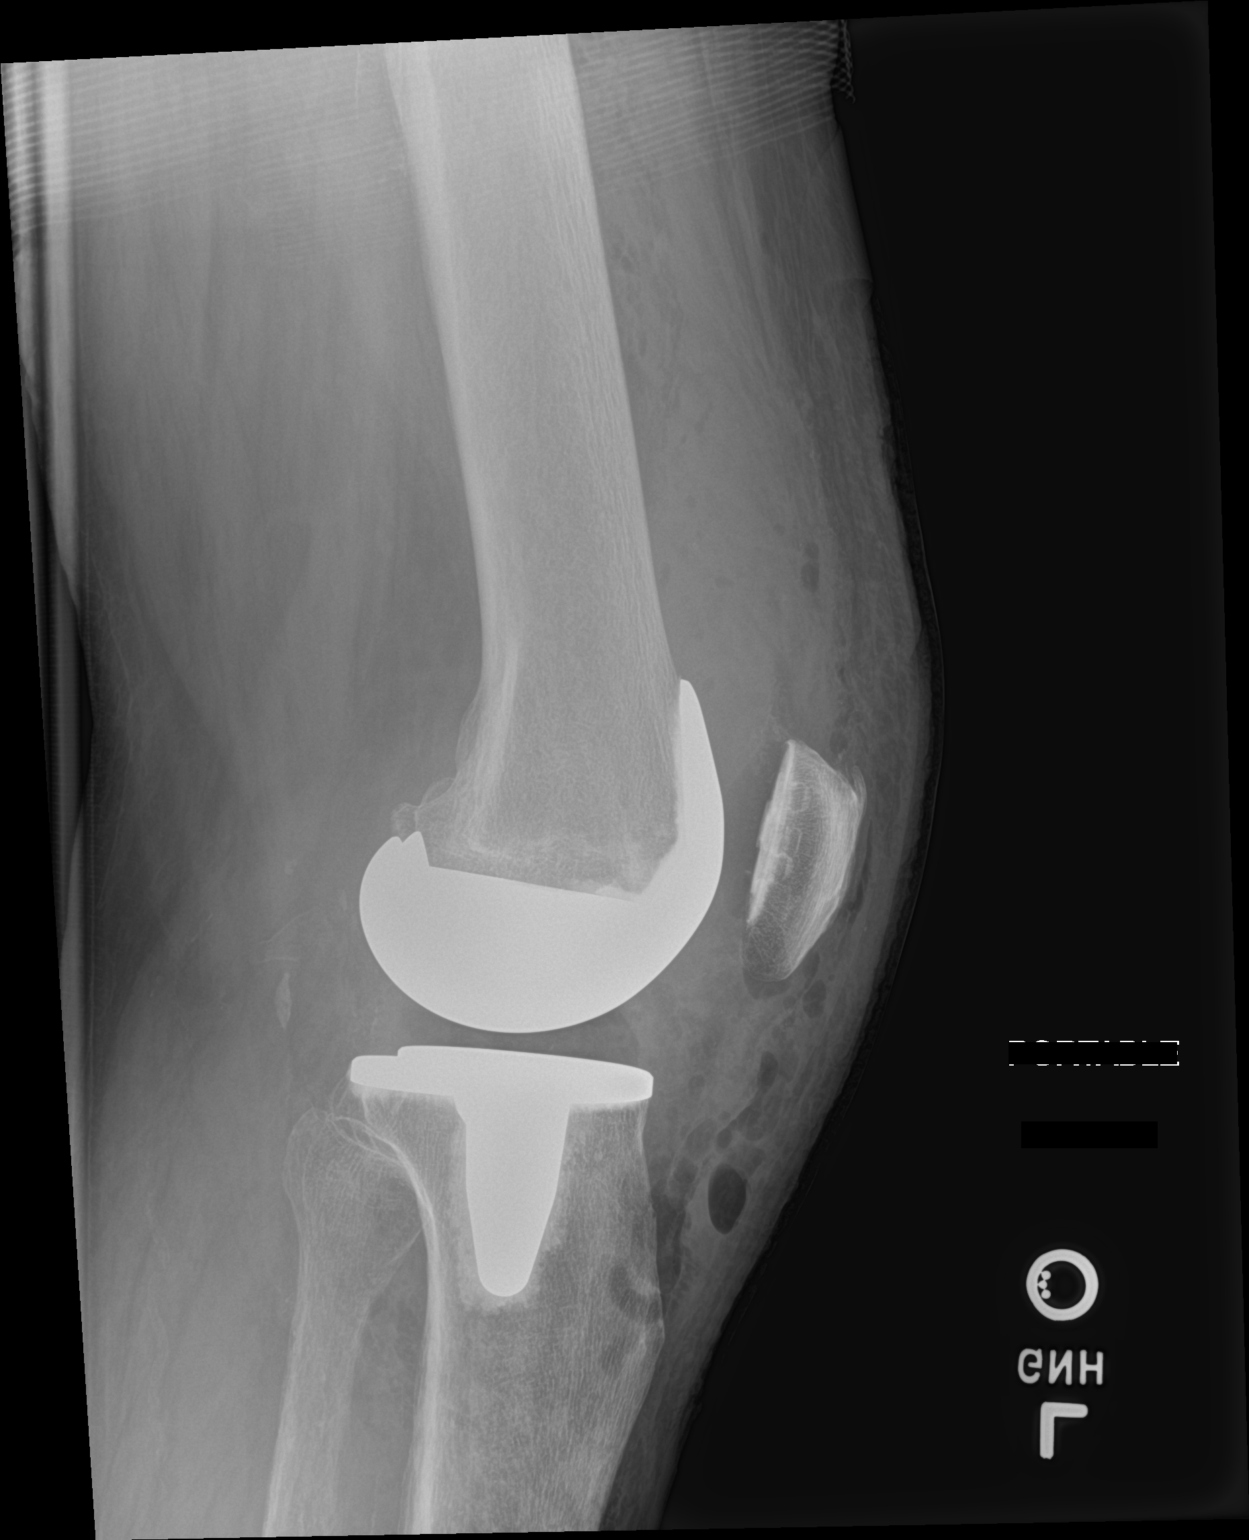

[2 of 2 positions shown; findings below may reference images not displayed]

FINDINGS: The left knee demonstrates a total knee arthroplasty without
evidence of hardware failure or complication. There is expected
intra-articular air and a large joint effusion. There is no fracture
or dislocation. The alignment is anatomic. No patella alta.
Post-surgical changes noted in the surrounding soft tissues.
IMPRESSION: 1. Status post left total knee arthroplasty without acute
postoperative complication. No patella alta.
